# Patient Record
Sex: Male | Born: 1965 | Race: White | Hispanic: No | Marital: Married | Smoking: Never smoker
Health system: Southern US, Community
[De-identification: ages and names within clinical notes are randomized; demographics above are authoritative.]

## PROBLEM LIST (undated history)

## (undated) DIAGNOSIS — I10 Essential (primary) hypertension: Secondary | ICD-10-CM

## (undated) DIAGNOSIS — R7303 Prediabetes: Secondary | ICD-10-CM

## (undated) DIAGNOSIS — F419 Anxiety disorder, unspecified: Secondary | ICD-10-CM

## (undated) HISTORY — PX: HERNIA REPAIR: SHX51

---

## 2003-09-08 ENCOUNTER — Inpatient Hospital Stay (HOSPITAL_COMMUNITY): Admission: EM | Admit: 2003-09-08 | Discharge: 2003-09-10 | Payer: Self-pay | Admitting: *Deleted

## 2003-09-12 ENCOUNTER — Emergency Department (HOSPITAL_COMMUNITY): Admission: EM | Admit: 2003-09-12 | Discharge: 2003-09-13 | Payer: Self-pay | Admitting: *Deleted

## 2006-06-26 ENCOUNTER — Ambulatory Visit: Admission: RE | Admit: 2006-06-26 | Discharge: 2006-06-26 | Payer: Self-pay | Admitting: Family Medicine

## 2006-06-28 ENCOUNTER — Ambulatory Visit: Payer: Self-pay | Admitting: Pulmonary Disease

## 2009-08-15 ENCOUNTER — Ambulatory Visit (HOSPITAL_COMMUNITY): Admission: RE | Admit: 2009-08-15 | Discharge: 2009-08-16 | Payer: Self-pay | Admitting: Surgery

## 2009-08-15 ENCOUNTER — Encounter (INDEPENDENT_AMBULATORY_CARE_PROVIDER_SITE_OTHER): Payer: Self-pay | Admitting: Surgery

## 2010-07-01 NOTE — Procedures (Signed)
NAMEARLING, Phillip [name]               ACCOUNT NO.:  [id number]   MEDICAL RECORD NO.:  [id number]          PATIENT TYPE:  OUT   LOCATION:  SLEEP LAB                     FACILITY:  [facility]   PHYSICIAN:  Barbaraann Share, MD,FCCPDATE OF BIRTH:  03/25/1967   DATE OF STUDY:  06/26/2006                            NOCTURNAL POLYSOMNOGRAM   REFERRING PHYSICIAN:   LOCATION:  Sleep lab.   REFERRING PHYSICIAN:  Dr. Marjory Lies.   INDICATIONS FOR THE STUDY:  Hypersomnia with sleep apnea.   EPWORTH SCORE:  Is 18.   SLEEP ARCHITECTURE:  The patient had a total sleep time of 310 minutes  with decreased slow wave sleep for age and very decreased REM.  Sleep  onset latency was normal and REM onset was very prolonged.  Sleep  efficiency was decreased at 78%.   RESPIRATORY DATA:  The patient underwent split night protocol where he  was found to have 207 obstructive events in the first 128 minutes of  sleep.  This gave the patient an apnea/hypopnea index over that time  period of 97 events per hour.  The events were not positional and there  was moderate snoring noted throughout.  By protocol the patient was  placed on a standard Ultram Mirage ResMed nasal CPAP mask and ultimately  titrated to a final pressure of 10-cm of water with excellent control  even through supine REM.   OXYGEN DATA:  The patient had O2 desaturation as low as 82% with his  obstructive events.   CARDIAC DATA:  The patient was noted to have cardiac decelerations with  the obstructive events.   MOVEMENT/PARASOMNIA:  No clinically significant leg jerks or abnormal  behaviors were noted.   IMPRESSION/RECOMMENDATIONS:  1. Split night study reveals very severe obstructive sleep      apnea/hypopnea syndrome with an apnea/hypopnea index during the      first half of the night of 97 events per hour.  There was O2      desaturation as low as 82%.  The patient was then placed on a      standard ResMed Ultram Mirage nasal mask and  ultimately titrated to      a final      pressure of 10-cm with excellent control even through supine REM.  2. Cardiac decelerations were noted with the patient's obstructive      events.      Barbaraann Share, MD,FCCP  Diplomate, American Board of Sleep  Medicine     [facility]/MEDQ  D:  06/30/2006 17:29:22  T:  07/01/2006 06:45:35  Job:  [id number]

## 2010-07-04 NOTE — H&P (Signed)
NAMEFRANZ, SVEC                         ACCOUNT NO.:  [id number]   MEDICAL RECORD NO.:  [id number]                   PATIENT TYPE:  IPS   LOCATION:  0507                                 FACILITY:  [facility]   PHYSICIAN:  Jasmine Pang, M.D.              DATE OF BIRTH:  03/02/1965   DATE OF ADMISSION:  09/08/2003  DATE OF DISCHARGE:                         PSYCHIATRIC ADMISSION ASSESSMENT   IDENTIFYING INFORMATION:  This is a voluntary admission.  This is a 45-year-  old married white male.  He presented at [facility] on  Thursday, July 21 and was prescribed Lexapro and Lenesta.  He was  complaining of mood swings and depression.  This had been precipitated by  financial issues.  Apparently 2-3 months ago he and his wife purchased a  home and there were repairs that were supposed to have been made, however  the seller did not make the repairs and this has transferred a large  financial burden to the patient and his wife.  He has lost 20 pounds in the  past month.  He has had an inability to concentrate, to complete tasks.  He  has not been sleeping well and it is now impacting his performance at work.  He states I feel somewhat like I'm being punished from a religious  perspective.  He was unwilling to disclose the perceived wrongdoing to the  ACT team member.  However apparently he has been viewing pornography on the  internet.  His father is a Optician, dispensing and he has a lot of religious overlay.   On Thursday when his newest infant daughter was born he had a visual  hallucinations of satan and hence that is what prompted his visit to  [facility].  He has also had thoughts to kill himself.  There  is a gun in the house, although he does not believe he would follow through.  His sleep is decreased.  He is awakening 3-4 times a night.  He has had mid  to terminal insomnia for the past 2-3 months.  He is obsessed with concerns  about the house.  The thoughts  are intrusive.   PAST PSYCHIATRIC HISTORY:  He has none.   SOCIAL HISTORY:  He received his B.S. for [employer].  He is employed  at [employer].  He has been married for 9 years.  He has a 26-year-old  daughter, a 74-month-old daughter, and an infant daughter born now 3 days  ago.   FAMILY HISTORY:  He states his paternal grandmother had a nervous breakdown  because of her marriage.   ALCOHOL AND DRUG ABUSE:  He denies.   PAST MEDICAL HISTORY:  Primary care Kollin Udell:  He has not actually been to a  physician prior to his visit at [facility] the other day,  since 1999.  He has no known medical problems.  He is not prescribed any  medications, although while at [facility] he was started on [place] for sleep.   ALLERGIES:  No known drug allergies.   POSITIVE PHYSICAL FINDINGS:  PHYSICAL EXAMINATION:  There were no positive  physical findings.  He is a well-nourished, well-developed young male who  appears his stated age of 67.  He does have some acne.  He is still a little  overweight but other than that his exam was within normal limits.   MENTAL STATUS EXAM:  He is alert and oriented x3.  He is well groomed and  dressed.  His speech is not pressured.  His mood is somewhat depressed and  anxious.  His affect is congruent.  His thought processes are not clear or  rational.  He is concerned about forgiveness, etc.  His judgment and insight  are fair, concentration and memory are fair, intelligence is at least  average.  He denies any further visual hallucinations from the other day.  He is not paranoid.  He is not deluded per se but he is somewhat obsessed  and he is stuck, he is ruminating.   ADMISSION DIAGNOSES:   AXIS I:  Major depressive disorder, single, severe, with psychotic features,  specifically visual hallucinations.   AXIS II:  Deferred.   AXIS III:  None known.   AXIS IV:  Severe.  Housing and economic.   AXIS V:  Global  assessment of function is 35.   PLAN:  Plan is to admit for safety and crisis stabilization.  To get him  some sleep, to initiate medications.  Post discharge, he is requesting  [place] counseling.  He wonders if his father, who is a Optician, dispensing, would  be appropriate.  It was suggested that he consider Dr. Alanson Aly as well  as Janalee Dane in Dr. Alwyn Ren office for therapy.  Again, he is trying to  minimize his situation.  He is quite embarrassed that he is here and does  not want to have to take medication.     Mickie Leonarda Salon, P.A.-C.               Jasmine Pang, M.D.    MD/MEDQ  D:  09/09/2003  T:  09/09/2003  Job:  [id number]

## 2010-07-04 NOTE — Discharge Summary (Signed)
Phillip [name], Phillip [name]                         ACCOUNT NO.:  [id number]   MEDICAL RECORD NO.:  [id number]                   PATIENT TYPE:  IPS   LOCATION:  0507                                 FACILITY:  [facility]   PHYSICIAN:  Geoffery Lyons, M.D.                   DATE OF BIRTH:  24-Nov-1965   DATE OF ADMISSION:  09/08/2003  DATE OF DISCHARGE:  09/10/2003                                 DISCHARGE SUMMARY   CHIEF COMPLAINT AND PRESENTING ILLNESS:  This was the first admission to  [facility]  for this 45 year old married white male,  presented to  Urgent Care, was prescribed Lexapro and Lunesta.  Complained  of mood swings and depression.  Had been precipitated by financial issues.  Apparently 2 or 3 months prior to this admission he and his wife purchased a  home and there were repairs that were supposed to have been made.  The  seller did not make the repairs and this resulted in a financial burden to  the patient and his wife.  Lost 20 pounds in the past month.  Inability to  concentrate, to complete tasks.  Had not been sleeping well.  Impacting his  performance at work.  He was religiously preoccupied.  He had apparently  been viewing pornography on the internet.  His father is a Optician, dispensing and he  had a lot of religious concerns.  On Thursday when his newest infant  daughter was born, he had a visual hallucination of Prudy Feeler and this prompted  to reach his brother and thought to kill himself.  There was a gun in the  house.  He did not believe he could follow through.   PAST PSYCHIATRIC HISTORY:  Denies history of previous treatment.   ALCOHOL AND DRUG HISTORY:  Denies the use or abuse of any substances.   PAST MEDICAL HISTORY:  Negative for any major medical conditions.   MEDICATIONS:  Recently prescribed Lexapro and Lunesta.   PHYSICAL EXAMINATION:  Performed, failed to show any acute findings.   LABORATORY WORKUP:  CBC within normal limits.  Blood chemistries were  within  normal limits.  SGOT 40, SGPT 37, total bilirubin 1.3.  Triglycerides  158.  TSH 2.789.   MENTAL STATUS EXAM:  Reveals an alert, cooperative male, well groomed and  dressed.  Speech was not pressured.  Mood depressed and anxious, affect was  congruent.  Thought processes were not clear, rational, concerned about  forgiveness.  Judgment and insight and insight were fair, concentration and  memory were fair. Sense of hopelessness, helplessness, concerned about  religious issues and guilt and shame.   ADMISSION DIAGNOSES:   AXIS I:  Major depressive disorder, rule out psychotic features.   AXIS II:  No diagnosis.   AXIS III:  No diagnosis.   AXIS IV:  Moderate.   AXIS V:  Global assessment of function upon admission 35,  highest global  assessment of function in past year 80.   COURSE IN HOSPITAL:  He was admitted and started on intensive individual and  group psychotherapy.  He was maintained on Lexapro.  He was given some  Ativan and Ambien for sleep.  By July 25, he was in full contact with  reality.  Endorsed no suicidal ideas, no homicidal ideas, no hallucinations,  no delusions.  Stated that he was feeling better once he was able to open up  and talk about his issues.  York Spaniel that his wife has already forgiven him.  He  felt that he was ready to move forward.  Endorsed some financial stress but  endorsed no suicidal ideas, no homicidal ideas.  Wanted to be out of the  hospital so he could resume his life.  There was a family session with the  wife, reported no suicidal ideation, endorsed that he would never act on any  ideas to hurt himself for the sake of his wife and his children.  Evidenced  some insight.  It was felt that he was baseline so he was discharged to  outpatient follow-up.   DISCHARGE DIAGNOSES:   AXIS I:  1. Major depression, single episode.  2. Anxiety disorder not otherwise specified.   AXIS II:  No diagnosis.   AXIS III:  No diagnosis.   AXIS  IV:  Moderate.   AXIS V:  Global assessment of function upon discharge 60.   DISCHARGE MEDICATIONS:  1. Lexapro 10 mg per day.  2. Ambien 10 at bedtime for sleep.   DISPOSITION:  Follow up with Dr. Lolly Mustache and therapist Florencia Reasons.                                               Geoffery Lyons, M.D.    IL/MEDQ  D:  10/03/2003  T:  10/04/2003  Job:  [id number]

## 2019-01-10 ENCOUNTER — Other Ambulatory Visit: Payer: Self-pay | Admitting: *Deleted

## 2019-01-10 DIAGNOSIS — Z20822 Contact with and (suspected) exposure to covid-19: Secondary | ICD-10-CM

## 2019-01-11 LAB — NOVEL CORONAVIRUS, NAA: SARS-CoV-2, NAA: NOT DETECTED

## 2019-01-29 ENCOUNTER — Other Ambulatory Visit: Payer: Self-pay

## 2019-01-29 ENCOUNTER — Emergency Department
Admission: EM | Admit: 2019-01-29 | Discharge: 2019-01-29 | Discharge status: Absent without leave | Payer: BC Managed Care – PPO | Source: Home / Self Care

## 2019-09-08 ENCOUNTER — Ambulatory Visit
Admission: EM | Admit: 2019-09-08 | Discharge: 2019-09-08 | Disposition: A | Discharge status: Home / Self Care | Payer: BC Managed Care – PPO | Attending: Emergency Medicine | Admitting: Emergency Medicine

## 2019-09-08 ENCOUNTER — Ambulatory Visit: Payer: Self-pay

## 2019-09-08 ENCOUNTER — Other Ambulatory Visit: Payer: Self-pay

## 2019-09-08 ENCOUNTER — Encounter: Payer: Self-pay | Admitting: Emergency Medicine

## 2019-09-08 DIAGNOSIS — M7521 Bicipital tendinitis, right shoulder: Secondary | ICD-10-CM | POA: Diagnosis not present

## 2019-09-08 HISTORY — DX: Prediabetes: R73.03

## 2019-09-08 MED ORDER — PREDNISONE 10 MG (21) PO TBPK: ORAL_TABLET | Freq: Every day | ORAL | 0 refills | Status: DC

## 2019-09-08 NOTE — ED Triage Notes (Signed)
Pain to bicep area on RT arm x 2weeks. At times RT hand feels numb and swollen.  Pt works on a computer all day

## 2019-09-08 NOTE — ED Provider Notes (Signed)
[facility]   [id number] 09/08/19 Arrival Time: 1534  CC: RT shoulder/ arm PAIN  SUBJECTIVE: History from: patient. Phillip [name] is a 54 y.o. male complains of RT arm pain x 1.5 weeks.  Denies a precipitating event or specific injury.  Does admit to frequent computer use.  Localizes the pain to the RT biceps.  Describes the pain as intermittent and sharp in character.  Has tried OTC medications with relief.  Symptoms are made worse with ROM.  Denies similar symptoms in the past.  Reports associated numbness in the RT hand.  Denies fever, chills, erythema, ecchymosis, effusion, weakness.    ROS: As per HPI.  All other pertinent ROS negative.     Past Medical History:  Diagnosis Date  . Borderline diabetic    Past Surgical History:  Procedure Laterality Date  . HERNIA REPAIR     No Known Allergies No current facility-administered medications on file prior to encounter.   No current outpatient medications on file prior to encounter.   Social History   Socioeconomic History  . Marital status: Married    Spouse name: Not on file  . Number of children: Not on file  . Years of education: Not on file  . Highest education level: Not on file  Occupational History  . Not on file  Tobacco Use  . Smoking status: Never Smoker  . Smokeless tobacco: Never Used  Substance and Sexual Activity  . Alcohol use: Never  . Drug use: Never  . Sexual activity: Not on file  Other Topics Concern  . Not on file  Social History Narrative  . Not on file   Social Determinants of Health   Financial Resource Strain:   . Difficulty of Paying Living Expenses:   Food Insecurity:   . Worried About Programme researcher, broadcasting/film/video in the Last Year:   . Barista in the Last Year:   Transportation Needs:   . Freight forwarder (Medical):   Marland Kitchen Lack of Transportation (Non-Medical):   Physical Activity:   . Days of Exercise per Week:   . Minutes of Exercise per Session:   Stress:   .  Feeling of Stress :   Social Connections:   . Frequency of Communication with Friends and Family:   . Frequency of Social Gatherings with Friends and Family:   . Attends Religious Services:   . Active Member of Clubs or Organizations:   . Attends Banker Meetings:   Marland Kitchen Marital Status:   Intimate Partner Violence:   . Fear of Current or Ex-Partner:   . Emotionally Abused:   Marland Kitchen Physically Abused:   . Sexually Abused:    Family History  Problem Relation Age of Onset  . Diabetes Mother   . Diabetes Father   . Heart failure Father     OBJECTIVE:  Vitals:   09/08/19 1604 09/08/19 1607  BP:  (!) 137/91  Pulse:  67  Resp:  18  Temp:  98.2 F (36.8 C)  TempSrc:  Oral  SpO2:  95%  Weight: (!) 233 lb 11 oz (106 kg)   Height: 5\' 8"  (1.727 m)     General appearance: ALERT; in no acute distress.  Head: NCAT Lungs: Normal respiratory effort CV: Radial pulse 2+. Cap refill < 2 seconds Musculoskeletal: Right shoulder/ arm Inspection: Skin warm, dry, clear and intact without obvious erythema, effusion, or ecchymosis.  Palpation: Nontender to palpation ROM: FROM active and passive Strength: 5/5 shld  abduction, 5/5 shld adduction, 5/5 elbow flexion, 5/5 elbow extension, 5/5 grip strength Skin: warm and dry Neurologic: Ambulates without difficulty; Sensation intact about the upper/ extremities Psychological: alert and cooperative; normal mood and affect   ASSESSMENT & PLAN:  1. Biceps tendinitis of right shoulder    Meds ordered this encounter  Medications  . predniSONE (STERAPRED UNI-PAK 21 TAB) 10 MG (21) TBPK tablet    Sig: Take by mouth daily. Take 6 tabs by mouth daily  for 2 days, then 5 tabs for 2 days, then 4 tabs for 2 days, then 3 tabs for 2 days, 2 tabs for 2 days, then 1 tab by mouth daily for 2 days    Dispense:  42 tablet    Refill:  0    Order Specific Question:   Supervising Provider    Answer:   Eustace Moore [[id number]]    Continue  conservative management of rest, ice, and gentle stretches  Limit painful activities  Prednisone prescribed. Take as directed and to completion Follow up with PCP or orthopedist if symptoms persist Return or go to the ER if you have any new or worsening symptoms (fever, chills, chest pain, redness, swelling, bruising, deformity, etc...)    Reviewed expectations re: course of current medical issues. Questions answered. Outlined signs and symptoms indicating need for more acute intervention. Patient verbalized understanding. After Visit Summary given.    Rennis Harding, PA-C 09/08/19 1628

## 2019-09-08 NOTE — Discharge Instructions (Signed)
Continue conservative management of rest, ice, and gentle stretches  Limit painful activities  Prednisone prescribed. Take as directed and to completion Follow up with PCP or orthopedist if symptoms persist Return or go to the ER if you have any new or worsening symptoms (fever, chills, chest pain, redness, swelling, bruising, deformity, etc...)

## 2019-10-24 ENCOUNTER — Other Ambulatory Visit: Payer: Self-pay | Admitting: Orthopaedic Surgery

## 2019-10-24 DIAGNOSIS — M542 Cervicalgia: Secondary | ICD-10-CM

## 2019-10-25 ENCOUNTER — Other Ambulatory Visit: Payer: Self-pay | Admitting: Orthopaedic Surgery

## 2019-10-25 DIAGNOSIS — M542 Cervicalgia: Secondary | ICD-10-CM

## 2019-11-14 ENCOUNTER — Ambulatory Visit
Admission: RE | Admit: 2019-11-14 | Discharge: 2019-11-14 | Disposition: A | Discharge status: Home / Self Care | Payer: BC Managed Care – PPO | Source: Ambulatory Visit | Attending: Orthopaedic Surgery | Admitting: Orthopaedic Surgery

## 2019-11-14 ENCOUNTER — Other Ambulatory Visit: Payer: Self-pay

## 2019-11-14 DIAGNOSIS — M542 Cervicalgia: Secondary | ICD-10-CM

## 2021-04-08 LAB — COLOGUARD: COLOGUARD: NEGATIVE

## 2021-12-30 IMAGING — CR DG ORBITS FOR FOREIGN BODY
2 series · 2 of 2 positions shown · non-contrast
Comparison: None.

CLINICAL DATA: Metal working/exposure; clearance prior to MRI

EXAM:
ORBITS FOR FOREIGN BODY - 2 VIEW

[w orbit pa (1 of 2)]
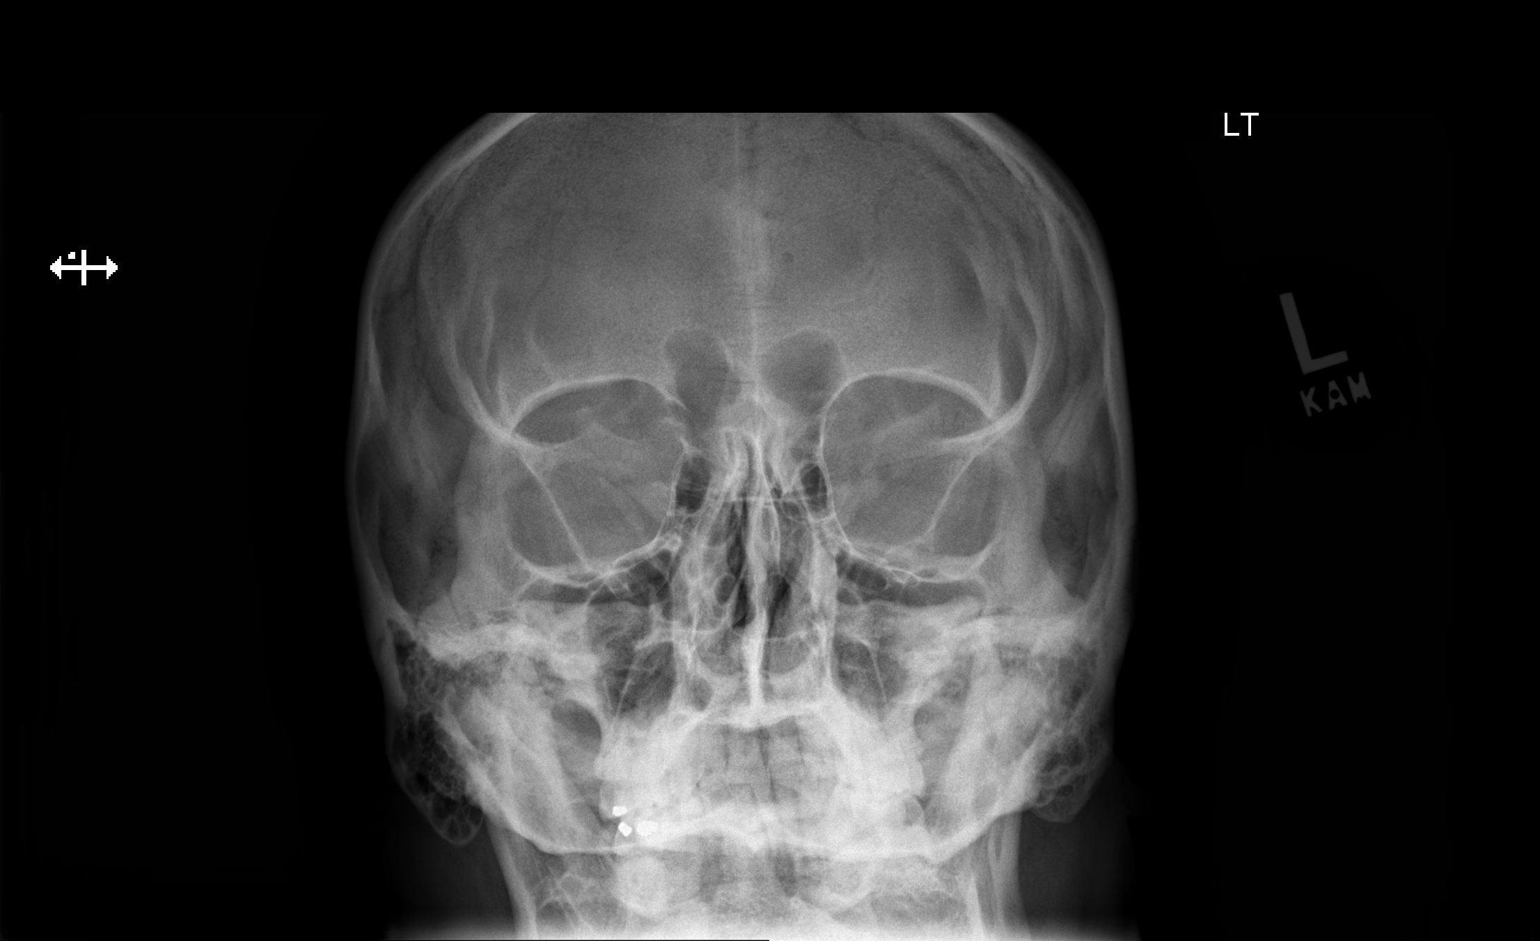

[w orbit pa (2 of 2)]
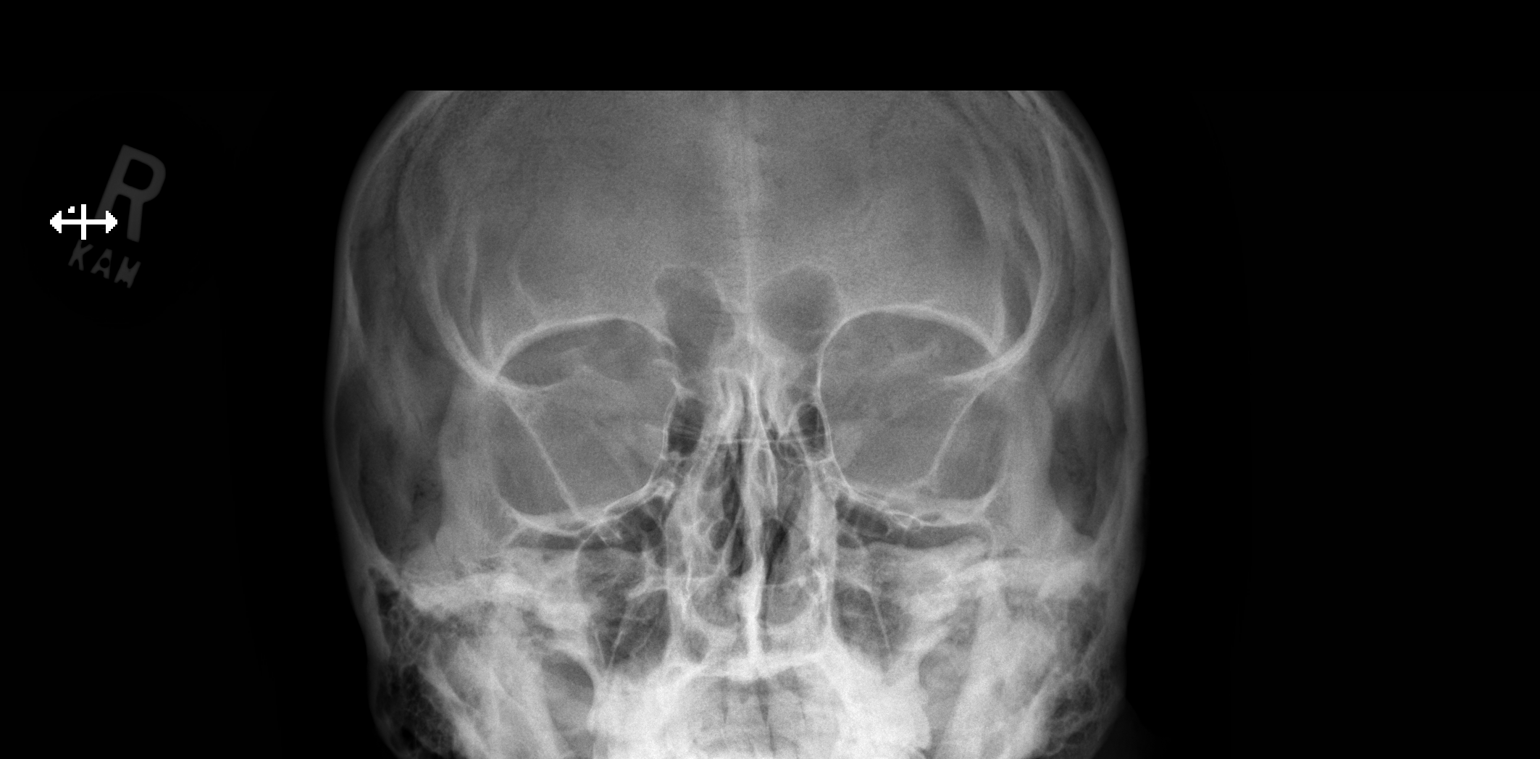

[2 of 2 positions shown; findings below may reference images not displayed]

FINDINGS: There is no evidence of metallic foreign body within the orbits. No
significant bone abnormality identified.
IMPRESSION: No evidence of metallic foreign body within the orbits.

## 2023-05-19 NOTE — Progress Notes (Signed)
 Date of Service: 05/19/2023 Patient DOB: 09-08-1965   Subjective:     Patient ID: Phillip [name] is a 58 y.o. male. Patient comes in for Medication Refill and Diabetes The patient is a 58yo male who presents for annual physical and medical evaluation. The patient has a history of:             -Diabetes: on metformin  1000 mg twice a day.       04/23 patient was started on Ozempic and titrated to 1mg  weekly, currently has no GI issues,  12/22/21 222lbs  08/11/22: 226lbs   05/19/23 (226 lbs).               A1C:   Lab Results  Component Value Date  HGBA1C 6.4 (H) 08/11/2022        Lipids:   Lab Results  Component Value Date  CHOL 128 08/11/2022  CHOL 148 03/17/2021     Lab Results  Component Value Date  HDL 47 (L) 08/11/2022  HDL 48 03/17/2021     Lab Results  Component Value Date  LDLCALC 59 08/11/2022     Lab Results  Component Value Date  TRIG 132 08/11/2022  TRIG 119 03/17/2021     No results found for: CHOLHDL     Microalbumin:  No results found for: LABPROT          Microalbumin: due  Component Value Date      ALBUMIN 4.4 03/17/2021             Blood sugars: no      Eye exam: due 05/23, eye doctor is in [place]       Foot exam:  05/19/23           -Hypertension: on valsartan 160 mg daily.         -elevated bilirubin:             Labs:    06/24 CBC, cholesterol, and PSA labs are normal.  Patient's BMP labs showed slightly elevated blood sugar and would recommend to decrease the carbohydrates in his diet and to stay active.  Patient's total bilirubin was slightly elevated (1.1 from 0.9) which has not changed much from the prior lab, A1c finally came back and has improved from 8.2-6.4.      03/18/21 CBC, cholesterol, hepatitis C, PSA, microalbumin labs were normal. Patient CMP had elevated blood sugars with an A1c of 8.2 meaning that patient's diabetes is not controlled.       Misc:       Review of Systems Review of Systems   Constitutional:  Negative for chills, fatigue and fever.  Respiratory:  Negative for cough, shortness of breath and wheezing.   Cardiovascular:  Negative for chest pain, palpitations and leg swelling.  Psychiatric/Behavioral:  Negative for agitation and behavioral problems.      Past Medical History:  Diagnosis Date  . Anxiety     Past Surgical History:  Procedure Laterality Date  . HERNIA REPAIR     Procedure: HERNIA REPAIR      No Known Allergies    Social History   Social History Narrative  . Not on file    Family History  Problem Relation Name Age of Onset  . Heart disease Father    . Hearing loss Father    . Diabetes Father    . Hypertension Other    . Diabetes Other    . Stroke Other    . Fibromyalgia Mother  Objective:  BP 128/82   Pulse 88   Temp 98.2 F (36.8 C)   Resp 18   Ht 1.715 m (5' 7.52)   Wt 103 kg (226 lb 6.4 oz)   SpO2 98%   BMI 34.92 kg/m   Physical Exam Physical Exam Constitutional:      General: He is not in acute distress.    Appearance: Normal appearance. He is not ill-appearing.  HENT:     Head: Normocephalic and atraumatic.  Cardiovascular:     Rate and Rhythm: Normal rate and regular rhythm.     Heart sounds: No murmur heard. Pulmonary:     Effort: Pulmonary effort is normal. No respiratory distress.     Breath sounds: No stridor. No wheezing, rhonchi or rales.  Neurological:     Mental Status: He is alert.  Psychiatric:        Mood and Affect: Mood normal.        Behavior: Behavior normal.         Labs: No results found for this or any previous visit (from the past week).  Assessment/Plan:  1. Type 2 diabetes mellitus without complication, without long-term current use of insulin   (Primary) ffice A1c 6.3 which is at goal below 7.0. Continue current medications. Follow-up with your eye doctor for annual diabetic eye exam.  Recommend a low fat/carbohydrate diet with increase in exercising.  - POC  Hemoglobin A1c  2. Essential hypertension controlled, continue current medication  Follow-up in 6 months for annual physical or sooner if needed.  Call for any questions/concerns.   Electronically signed by: Shawn P Lazoff, DO 05/19/2023 4:03 PM  This document was created using the aid of voice recognition Dragon dictation software.

## 2023-10-19 ENCOUNTER — Ambulatory Visit (HOSPITAL_COMMUNITY)
Admission: EM | Admit: 2023-10-19 | Discharge: 2023-10-19 | Disposition: A | Discharge status: Other Acute Inpatient Hospital

## 2023-10-19 ENCOUNTER — Inpatient Hospital Stay
Admission: AD | Admit: 2023-10-19 | Discharge: 2023-10-25 | DRG: 885 | Disposition: A | Discharge status: Home / Self Care | Source: Intra-hospital | Attending: Psychiatry | Admitting: Psychiatry

## 2023-10-19 DIAGNOSIS — F653 Voyeurism: Secondary | ICD-10-CM | POA: Insufficient documentation

## 2023-10-19 DIAGNOSIS — R45851 Suicidal ideations: Secondary | ICD-10-CM | POA: Insufficient documentation

## 2023-10-19 DIAGNOSIS — I10 Essential (primary) hypertension: Secondary | ICD-10-CM | POA: Diagnosis present

## 2023-10-19 DIAGNOSIS — F333 Major depressive disorder, recurrent, severe with psychotic symptoms: Principal | ICD-10-CM | POA: Diagnosis present

## 2023-10-19 DIAGNOSIS — R7303 Prediabetes: Secondary | ICD-10-CM | POA: Diagnosis present

## 2023-10-19 DIAGNOSIS — Z8249 Family history of ischemic heart disease and other diseases of the circulatory system: Secondary | ICD-10-CM

## 2023-10-19 DIAGNOSIS — F411 Generalized anxiety disorder: Secondary | ICD-10-CM | POA: Diagnosis not present

## 2023-10-19 DIAGNOSIS — Z833 Family history of diabetes mellitus: Secondary | ICD-10-CM | POA: Diagnosis not present

## 2023-10-19 DIAGNOSIS — Z7984 Long term (current) use of oral hypoglycemic drugs: Secondary | ICD-10-CM | POA: Diagnosis not present

## 2023-10-19 DIAGNOSIS — Z818 Family history of other mental and behavioral disorders: Secondary | ICD-10-CM | POA: Diagnosis not present

## 2023-10-19 DIAGNOSIS — F410 Panic disorder [episodic paroxysmal anxiety] without agoraphobia: Secondary | ICD-10-CM | POA: Diagnosis present

## 2023-10-19 DIAGNOSIS — F66 Other sexual disorders: Secondary | ICD-10-CM | POA: Diagnosis not present

## 2023-10-19 HISTORY — DX: Anxiety disorder, unspecified: F41.9

## 2023-10-19 HISTORY — DX: Prediabetes: R73.03

## 2023-10-19 HISTORY — DX: Essential (primary) hypertension: I10

## 2023-10-19 LAB — COMPREHENSIVE METABOLIC PANEL WITH GFR
ALT: 36 U/L (ref 0–44)
AST: 28 U/L (ref 15–41)
Albumin: 4.7 g/dL (ref 3.5–5.0)
Alkaline Phosphatase: 52 U/L (ref 38–126)
Anion gap: 12 (ref 5–15)
BUN: 25 mg/dL — ABNORMAL HIGH (ref 6–20)
CO2: 24 mmol/L (ref 22–32)
Calcium: 10.1 mg/dL (ref 8.9–10.3)
Chloride: 103 mmol/L (ref 98–111)
Creatinine, Ser: 1.65 mg/dL — ABNORMAL HIGH (ref 0.61–1.24)
GFR, Estimated: 48 mL/min — ABNORMAL LOW (ref >60)
Glucose, Bld: 101 mg/dL — ABNORMAL HIGH (ref 70–99)
Potassium: 4.2 mmol/L (ref 3.5–5.1)
Sodium: 139 mmol/L (ref 135–145)
Total Bilirubin: 2 mg/dL — ABNORMAL HIGH (ref 0.0–1.2)
Total Protein: 7.6 g/dL (ref 6.5–8.1)

## 2023-10-19 LAB — CBC WITH DIFFERENTIAL/PLATELET
Abs Immature Granulocytes: 0.02 K/uL (ref 0.00–0.07)
Basophils Absolute: 0.1 K/uL (ref 0.0–0.1)
Basophils Relative: 1 %
Eosinophils Absolute: 0.1 K/uL (ref 0.0–0.5)
Eosinophils Relative: 1 %
HCT: 52.2 % — ABNORMAL HIGH (ref 39.0–52.0)
Hemoglobin: 17.9 g/dL — ABNORMAL HIGH (ref 13.0–17.0)
Immature Granulocytes: 0 %
Lymphocytes Relative: 25 %
Lymphs Abs: 2 K/uL (ref 0.7–4.0)
MCH: 32.1 pg (ref 26.0–34.0)
MCHC: 34.3 g/dL (ref 30.0–36.0)
MCV: 93.7 fL (ref 80.0–100.0)
Monocytes Absolute: 0.7 K/uL (ref 0.1–1.0)
Monocytes Relative: 9 %
Neutro Abs: 5.1 K/uL (ref 1.7–7.7)
Neutrophils Relative %: 64 %
Platelets: 312 K/uL (ref 150–400)
RBC: 5.57 MIL/uL (ref 4.22–5.81)
RDW: 12.6 % (ref 11.5–15.5)
WBC: 7.9 K/uL (ref 4.0–10.5)
nRBC: 0 % (ref 0.0–0.2)

## 2023-10-19 LAB — LIPID PANEL
Cholesterol: 172 mg/dL (ref 0–200)
HDL: 52 mg/dL (ref >40)
LDL Cholesterol: 95 mg/dL (ref 0–99)
Total CHOL/HDL Ratio: 3.3 ratio
Triglycerides: 127 mg/dL (ref <150)
VLDL: 25 mg/dL (ref 0–40)

## 2023-10-19 LAB — POCT URINE DRUG SCREEN - MANUAL ENTRY (I-SCREEN)
POC Amphetamine UR: NOT DETECTED
POC Buprenorphine (BUP): NOT DETECTED
POC Cocaine UR: NOT DETECTED
POC Marijuana UR: NOT DETECTED
POC Methadone UR: NOT DETECTED
POC Methamphetamine UR: NOT DETECTED
POC Morphine: NOT DETECTED
POC Oxazepam (BZO): POSITIVE — AB
POC Oxycodone UR: NOT DETECTED
POC Secobarbital (BAR): NOT DETECTED

## 2023-10-19 LAB — GLUCOSE, CAPILLARY: Glucose-Capillary: 153 mg/dL — ABNORMAL HIGH (ref 70–99)

## 2023-10-19 LAB — HEMOGLOBIN A1C
Hgb A1c MFr Bld: 6 % — ABNORMAL HIGH (ref 4.8–5.6)
Mean Plasma Glucose: 125.5 mg/dL

## 2023-10-19 LAB — ETHANOL: Alcohol, Ethyl (B): <15 mg/dL (ref <15)

## 2023-10-19 LAB — TSH: TSH: 2.398 u[IU]/mL (ref 0.350–4.500)

## 2023-10-19 LAB — SARS CORONAVIRUS 2 BY RT PCR: SARS Coronavirus 2 by RT PCR: NEGATIVE

## 2023-10-19 MED ORDER — OLANZAPINE 10 MG IM SOLR: 5.0000 mg | Freq: Three times a day (TID) | INTRAMUSCULAR | Status: DC | PRN

## 2023-10-19 MED ORDER — HALOPERIDOL 5 MG PO TABS: 5.0000 mg | ORAL_TABLET | Freq: Three times a day (TID) | ORAL | Status: DC | PRN

## 2023-10-19 MED ORDER — METFORMIN HCL ER (OSM) 1000 MG PO TB24
1000.0000 mg | ORAL_TABLET | Freq: Two times a day (BID) | ORAL | Status: AC
Start: 2023-10-19 — End: ?

## 2023-10-19 MED ORDER — HALOPERIDOL LACTATE 5 MG/ML IJ SOLN: 10.0000 mg | Freq: Three times a day (TID) | INTRAMUSCULAR | Status: DC | PRN

## 2023-10-19 MED ORDER — TRAZODONE HCL 50 MG PO TABS: 50.0000 mg | ORAL_TABLET | Freq: Every evening | ORAL | Status: DC | PRN

## 2023-10-19 MED ORDER — METFORMIN HCL ER 500 MG PO TB24
1000.0000 mg | ORAL_TABLET | Freq: Two times a day (BID) | ORAL | Status: DC
  Administered 2023-10-20 – 2023-10-25 (×12): 1000 mg via ORAL
  Filled 2023-10-19 (×12): qty 2

## 2023-10-19 MED ORDER — ALUM & MAG HYDROXIDE-SIMETH 200-200-20 MG/5ML PO SUSP: 30.0000 mL | ORAL | Status: DC | PRN

## 2023-10-19 MED ORDER — IRBESARTAN 150 MG PO TABS: 150.0000 mg | ORAL_TABLET | Freq: Every day | ORAL | Status: DC

## 2023-10-19 MED ORDER — ACETAMINOPHEN 325 MG PO TABS: 650.0000 mg | ORAL_TABLET | Freq: Four times a day (QID) | ORAL | Status: DC | PRN

## 2023-10-19 MED ORDER — LORAZEPAM 2 MG/ML IJ SOLN: 2.0000 mg | Freq: Three times a day (TID) | INTRAMUSCULAR | Status: DC | PRN

## 2023-10-19 MED ORDER — DIPHENHYDRAMINE HCL 50 MG/ML IJ SOLN: 50.0000 mg | Freq: Three times a day (TID) | INTRAMUSCULAR | Status: DC | PRN

## 2023-10-19 MED ORDER — TRAZODONE HCL 50 MG PO TABS
50.0000 mg | ORAL_TABLET | Freq: Every evening | ORAL | Status: DC | PRN
  Administered 2023-10-19 – 2023-10-20 (×2): 50 mg via ORAL
  Filled 2023-10-19 (×5): qty 1

## 2023-10-19 MED ORDER — HYDROXYZINE HCL 25 MG PO TABS
25.0000 mg | ORAL_TABLET | Freq: Three times a day (TID) | ORAL | Status: DC | PRN
  Filled 2023-10-19: qty 1

## 2023-10-19 MED ORDER — MAGNESIUM HYDROXIDE 400 MG/5ML PO SUSP: 30.0000 mL | Freq: Every day | ORAL | Status: DC | PRN

## 2023-10-19 MED ORDER — IRBESARTAN 150 MG PO TABS: 150.0000 mg | ORAL_TABLET | Freq: Every day | ORAL | Status: AC

## 2023-10-19 MED ORDER — IRBESARTAN 150 MG PO TABS
150.0000 mg | ORAL_TABLET | Freq: Every day | ORAL | Status: DC
  Administered 2023-10-20 – 2023-10-25 (×6): 150 mg via ORAL
  Filled 2023-10-19 (×6): qty 1

## 2023-10-19 MED ORDER — HYDROXYZINE HCL 25 MG PO TABS: 25.0000 mg | ORAL_TABLET | Freq: Three times a day (TID) | ORAL | Status: DC | PRN

## 2023-10-19 MED ORDER — HALOPERIDOL LACTATE 5 MG/ML IJ SOLN: 5.0000 mg | Freq: Three times a day (TID) | INTRAMUSCULAR | Status: DC | PRN

## 2023-10-19 MED ORDER — METFORMIN HCL ER 500 MG PO TB24: 1000.0000 mg | ORAL_TABLET | Freq: Two times a day (BID) | ORAL | Status: DC

## 2023-10-19 MED ORDER — DIPHENHYDRAMINE HCL 50 MG PO CAPS: 50.0000 mg | ORAL_CAPSULE | Freq: Three times a day (TID) | ORAL | Status: DC | PRN

## 2023-10-19 MED ORDER — OLANZAPINE 5 MG PO TBDP: 5.0000 mg | ORAL_TABLET | Freq: Three times a day (TID) | ORAL | Status: DC | PRN

## 2023-10-19 NOTE — BH Assessment (Addendum)
 Comprehensive Clinical Assessment (CCA) Note  10/19/2023 Phillip [name] [id number]  Disposition: Per [employer], NP inpatient treatment is recommended.  [facility] to review.  Disposition SW to pursue appropriate inpatient options.  The patient demonstrates the following risk factors for suicide: Chronic risk factors for suicide include: psychiatric disorder of anxiety, depression and addiction issues. Acute risk factors for suicide include: family or marital conflict and social withdrawal/isolation. Protective factors for this patient include: positive social support, responsibility to others (children, family), and hope for the future. Considering these factors, the overall suicide risk at this point appears to be low. Patient is appropriate for outpatient follow up, once stabilized.   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 Patient presents to [facility] with his oldest daughter and wife, voluntarily.  Patient states he does not take medications. Patient reports ongoing issues with porn addiction that started at age 20.  He also admits he has viewed porn while at work, so he fears he will lose his job soon. Patient states his job of 30 years stresses him out and he has not been sleeping for approximately 1 week. Patient states it has been hard to focus, and he is also feeling paranoid believing people are plotting against him and watching him, and he is especially concerned that co-workers are watching him. Patient admits he has hurt my wife in the past by watching porn. Patient states, I just haven't been able to kick it. Patient states in the past he has received treatment for his porn addiction, depression and anxiety at [facility] and [facility] between 2005 or 2008. Per chart review, patient was hospitalized and the [facility] from 09/08/23 to 09/10/23. He denies outpatient psychiatry or counseling. He states that in the  past he was prescribed Seroquel  but stopped taking medication years ago because he felt like he did not need the medication. Treatment options were discussed, and patient is stating he prefers inpatient treatment due to his worsening depressive symptoms.  Patient endorses passive SI with no plan. Phillip denies HI, AVH and alcohol and substance use.  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 Self What Is the Reason for Your Visit/Call Today? Phillip [name] 58Y male presents to [facility] with his oldest daughter and wife, voluntarily. Phillip states he has diagnosed anxiety and depression. Phillip states he does not take medications. Phillip states he is addicted to porn and has looked at it on his job so he fears he will lose his job soon. Phillip states his job of 30 years stresses him out and he has not been sleeping for approximately 1 week. Phillip states it has been hard to focus and states he thinks the police is after him. Phillip also thinks people at work are watching him. Phillip thinks he is suffering from paranoia somewhat. Phillip states he has hurt his wife in the past by watching porn. Phillip states I just haven't been able to kick it. Phillip endorses passive SI with no plan. Phillip denies HI, AVH  and alcohol and substance use.  How Long Has This Been Causing You Problems? 1 wk - 1 month  What Do You Feel Would Help You the Most Today? Social Support; Treatment for Depression or other mood problem; Stress Management; Medication(s)   Have You Recently Had Any Thoughts About Hurting Yourself? Yes  Are You Planning to Commit Suicide/Harm Yourself At This time? No   Flowsheet Row ED from 10/19/2023 in [facility]   C-SSRS RISK CATEGORY No Risk    Have you Recently Had Thoughts About Hurting Someone Sherral? No  Are You Planning to Harm Someone at This Time? No  Explanation: N/A   Have You Used Any Alcohol or Drugs in the Past 24 Hours? No  How Long Ago Did You Use Drugs or Alcohol? N/A What Did You Use and How Much? N/A  Do You Currently Have a Therapist/Psychiatrist? No  Name of Therapist/Psychiatrist:    Have You Been Recently Discharged From Any Office Practice or Programs? No  Explanation of Discharge From Practice/Program: N/A    CCA Screening Triage Referral Assessment Type of Contact: Face-to-Face  Telemedicine Service Delivery:   Is this Initial or Reassessment?   Date Telepsych consult ordered in [facility]:    Time Telepsych consult ordered in [facility]:    Location of Assessment: [facility] Assessment Services  Provider Location: [facility] Assessment Services   Collateral Involvement: Wife provided some collateral.   Does Patient Have a Automotive engineer Guardian? No  Legal Guardian Contact Information: N/A  Copy of Legal Guardianship Form: -- (N/A)  Legal Guardian Notified of Arrival: -- (N/A)  Legal Guardian Notified of Pending Discharge: -- (N/A)  If Minor and Not Living with Parent(s), Who has Custody? N/A  Is CPS involved or ever been involved? Never  Is APS involved or ever been involved? Never   Patient Determined To Be At Risk for Harm To Self or Others Based on Review of Patient Reported Information or Presenting Complaint? No  Method: -- (N/A, no HI)  Availability of Means: -- (N/A, no HI)  Intent: -- (N/A, no HI)  Notification Required: -- (N/A, no HI)  Additional Information for Danger to Others Potential: -- (N/A, no HI)  Additional Comments for Danger to Others Potential: N/A, no HI  Are There Guns or Other Weapons in Your Home? No  Types of Guns/Weapons: N/A  Are These Weapons Safely Secured?                            -- (N/A)  Who Could  Verify You Are Able To Have These Secured: N/A  Do You Have any Outstanding Charges, Pending Court Dates, Parole/Probation? Denies  Contacted To Inform of Risk of Harm To Self or Others: Family/Significant Other:    Does Patient Present under Involuntary Commitment? No    [place] of Residence: [place]   Patient Currently Receiving the Following Services: Not Receiving Services   Determination of Need: Urgent (48 hours)   Options For Referral: [facility]; Outpatient Therapy; Inpatient Hospitalization     CCA Biopsychosocial Patient Reported Schizophrenia/Schizoaffective Diagnosis in Past: No   Strengths: Patient is seeking treatment. He has support.   Mental Health Symptoms Depression:  Difficulty Concentrating; Fatigue; Increase/decrease in appetite; Tearfulness; Worthlessness   Duration of Depressive symptoms: Duration of Depressive Symptoms: Greater than two weeks   Mania:  None   Anxiety:   Worrying; Tension; Sleep; Restlessness; Difficulty concentrating  Psychosis:  None   Duration of Psychotic symptoms:    Trauma:  None   Obsessions:  None   Compulsions:  None   Inattention:  N/A   Hyperactivity/Impulsivity:  N/A   Oppositional/Defiant Behaviors:  N/A   Emotional Irregularity:  Chronic feelings of emptiness   Other Mood/Personality Symptoms:  NA    Mental Status Exam Appearance and self-care  Stature:  Average   Weight:  Average weight   Clothing:  Casual   Grooming:  Normal   Cosmetic use:  None   Posture/gait:  Normal   Motor activity:  Not Remarkable   Sensorium  Attention:  Normal   Concentration:  Normal   Orientation:  X5   Recall/memory:  Normal   Affect and Mood  Affect:  Appropriate   Mood:  Depressed; Anxious   Relating  Eye contact:  Normal   Facial expression:  Anxious; Depressed   Attitude toward examiner:  Cooperative   Thought and Language  Speech flow: Clear and Coherent   Thought  content:  Appropriate to Mood and Circumstances   Preoccupation:  None   Hallucinations:  None   Organization:  Intact   [employer] of Knowledge:  Average   Intelligence:  Average   Abstraction:  Normal   Judgement:  Fair   Dance movement psychotherapist:  Adequate   Insight:  Fair   Decision Making:  Impulsive; Vacilates   Social Functioning  Social Maturity:  Isolates   Social Judgement:  Normal   Stress  Stressors:  Illness (SA issues)   Coping Ability:  Overwhelmed   Skill Deficits:  Self-control   Supports:  Friends/Service system; Family     Religion: Religion/Spirituality Are You A Religious Person?: No How Might This Affect Treatment?: N/A  Leisure/Recreation: Leisure / Recreation Do You Have Hobbies?: No  Exercise/Diet: Exercise/Diet Do You Exercise?: No Have You Gained or Lost A Significant Amount of Weight in the Past Six Months?: No Do You Follow a Special Diet?: No Do You Have Any Trouble Sleeping?: Yes Explanation of Sleeping Difficulties: varies   CCA Employment/Education Employment/Work Situation: Employment / Work Situation Employment Situation: Employed Work Stressors: Concerned 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 Patient's Job has Been Impacted by Current Illness: No Has Patient ever Been in the [employer]?: No  Education: Education Is Patient Currently Attending School?: No Last Grade Completed:  (NA) Did You Attend College?:  (NA) Did You Have An Individualized Education Program (IIEP): No Did You Have Any Difficulty At School?: No Patient's Education Has Been Impacted by Current Illness: No   CCA Family/Childhood History Family and Relationship History: Family history Marital status: Married Number of Years Married:  (NA) What types of issues is patient dealing with in the relationship?: Patient is mostly focused on his ongoing struggle with porn addiction.  His wife is somewhat aware  of his struggle, especially recent issues per provider report. Additional relationship information: NA Does patient have children?: Yes How many children?: 2 How is patient's relationship with their children?: No concerns noted  Childhood History:  Childhood History By whom was/is the patient raised?: Both parents Did patient suffer any verbal/emotional/physical/sexual abuse as a child?: No Did patient suffer from severe childhood neglect?: No Has patient ever been sexually abused/assaulted/raped as an adolescent or adult?: No Was the patient ever a victim of a crime or a disaster?: No Witnessed domestic violence?: No Has patient been affected by domestic violence as an adult?:  No       CCA Substance Use Alcohol/Drug Use: Alcohol / Drug Use Pain Medications: See MAR Prescriptions: See MAR Over the Counter: See MAR History of alcohol / drug use?: No history of alcohol / drug abuse                         ASAM's:  Six Dimensions of Multidimensional Assessment  Dimension 1:  Acute Intoxication and/or Withdrawal Potential:      Dimension 2:  Biomedical Conditions and Complications:      Dimension 3:  Emotional, Behavioral, or Cognitive Conditions and Complications:     Dimension 4:  Readiness to Change:     Dimension 5:  Relapse, Continued use, or Continued Problem Potential:     Dimension 6:  Recovery/Living Environment:     ASAM Severity Score:    ASAM Recommended Level of Treatment:     Substance use Disorder (SUD)    Recommendations for Services/Supports/Treatments:    Disposition Recommendation per psychiatric provider: We recommend inpatient psychiatric hospitalization when medically cleared. Patient is under voluntary admission status at this time; please IVC if attempts to leave hospital.   DSM5 Diagnoses: There are no active problems to display for this patient.    Referrals to Alternative Service(s): Referred to Alternative Service(s):    Place:   Date:   Time:    Referred to Alternative Service(s):   Place:   Date:   Time:    Referred to Alternative Service(s):   Place:   Date:   Time:    Referred to Alternative Service(s):   Place:   Date:   Time:     Deland LITTIE Louder, [facility]

## 2023-10-19 NOTE — Group Note (Signed)
 Date:  10/19/2023 Time:  11:10 PM  Group Topic/Focus:  Wrap-Up Group:   The focus of this group is to help patients review their daily goal of treatment and discuss progress on daily workbooks.    Participation Level:  Did Not Attend  Participation Quality:     Affect:     Cognitive:     Insight: None  Engagement in Group:  None  Modes of Intervention:     Additional Comments:    [name] 10/19/2023, 11:10 PM

## 2023-10-19 NOTE — ED Provider Notes (Signed)
 [facility] Continuous Assessment Admission H&P  Date: 10/19/23 Patient Name: Phillip [name] MRN: [id number] Chief Complaint: worsening depression, anxiety and passive suicidal ideations with no plan or intent.  Diagnoses:  Final diagnoses:  Severe episode of recurrent major depressive disorder, with psychotic features (HCC)  Pornography addiction  Generalized anxiety disorder  Passive suicidal ideations    HPI: Phillip [name] is a 58 year old male patient with a reported psychiatric history significant for pornography addiction, MDD and GAD who presented to the [facility] voluntary accompanied by his wife Duey Liller with complaints of worsening depression, anxiety and passive suicidal ideations with no plan or intent.  Patient reports worsening depression and anxiety for the past few months. He endorses symptoms of feelings of guilt, feeling like something bad is going to happen, excessive worry, feeling like he is being watched or followed, poor appetite and poor sleep. He reports a poor appetite for the past week and states that he's loss about 2-3lbs. He reports difficulty sleeping and states that he has been sleeping about 2 to 3 hours per night and has not been getting quality sleep for the past 6 to 8 months. He endorses passive suicidal thoughts that he describes as being better off not here and states that he would never do anything to kill himself. He reports having generalized thoughts of death for the past month with no plan or intent. He denies past suicide attempts. He reports feeling paranoid like he is going to lose his job and feeling like people are watching him. He denies auditory or visual hallucinations. Objectively, no signs of acute psychosis.   He identifies current stressors attributing to his symptoms as porn addiction and risk of losing his job, wife and children. He states that he has been watching porn since he was 58 years old  and states that his porn addiction has gotten worse over the past 3 to 4 years in frequency and and watching porn at work. He states in the past he has received treatment for his porn addiction, depression and anxiety at [facility] and [facility] between 2005 or 2008. Per chart review, patient was hospitalized and the [facility] from 09/08/23 to 09/10/23. He denies outpatient psychiatry or counseling. He states that in the past he was prescribed Seroquel  but stopped taking medication years ago because he felt like he did not need the medication.   He denies drinking alcohol or using illicit drugs. However, he later expressed taking 1 Xanax pill yesterday. He resides with his wife and 2 daughters. He has 3 adult daughters. 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 He denies legal issues.  He reports a medical history of diabetes type 2 and hypertension. He is currently prescribed valsartan, metformin  and Ozempic weekly. Per chart review, patient is prescribed Ozempic inject 1 mg every 7 days, per patient last administered on Saturday, 8/31, metformin  1000 mg twice daily, valsartan 160 mg daily, preferably at night.   Total Time spent with patient: 45 minutes  Musculoskeletal  Strength & Muscle Tone: within normal limits Gait & Station: normal Patient leans: N/A  Psychiatric Specialty Exam  Presentation General Appearance:  Appropriate for Environment  Eye Contact: Fair  Speech: Clear and Coherent  Speech Volume: Normal  Handedness: Right   Mood and Affect  Mood: Depressed  Affect: Congruent; Tearful   Thought Process  Thought Processes: Coherent  Descriptions of  Associations:Intact  Orientation:Full (Time, Place and Person)  Thought Content:Logical    Hallucinations:Hallucinations: None  Ideas of Reference:None  Suicidal Thoughts:Suicidal Thoughts: Yes, Passive SI Passive Intent and/or  Plan: Without Plan; Without Intent  Homicidal Thoughts:Homicidal Thoughts: No   Sensorium  Memory: Immediate Fair; Recent Fair; Remote Fair  Judgment: Fair  Insight: Fair   Chartered certified accountant: Fair  Attention Span: Fair  Recall: [employer] of Knowledge: Fair  Language: Fair   Psychomotor Activity  Psychomotor Activity: Psychomotor Activity: Normal   Assets  Assets: Manufacturing systems engineer; Desire for Improvement; Financial Resources/Insurance; Housing; Leisure Time; Intimacy; Physical Health; Social Support; English as a second language teacher; Vocational/Educational   Sleep  Sleep: Sleep: Poor Number of Hours of Sleep: 3   Nutritional Assessment (For OBS and FBC admissions only) Has the patient had a weight loss or gain of 10 pounds or more in the last 3 months?: No Has the patient had a decrease in food intake/or appetite?: Yes Does the patient have dental problems?: No Does the patient have eating habits or behaviors that may be indicators of an eating disorder including binging or inducing vomiting?: No Has the patient recently lost weight without trying?: 1 Has the patient been eating poorly because of a decreased appetite?: 1 Malnutrition Screening Tool Score: 2    Physical Exam Cardiovascular:     Rate and Rhythm: Tachycardia present.     Comments: Mildly hypertensive  Pulmonary:     Effort: Pulmonary effort is normal.  Musculoskeletal:        General: Normal range of motion.     Cervical back: Normal range of motion.  Neurological:     Mental Status: He is alert and oriented to person, place, and time.    Review of Systems  Constitutional: Negative.   HENT: Negative.    Eyes: Negative.   Respiratory: Negative.    Cardiovascular: Negative.   Gastrointestinal: Negative.   Genitourinary: Negative.   Musculoskeletal: Negative.   Neurological: Negative.   Endo/Heme/Allergies: Negative.   Psychiatric/Behavioral:  Positive for depression.  The patient is nervous/anxious.     Blood pressure (!) 119/93, pulse (!) 124, temperature 98.7 F (37.1 C), temperature source Temporal, resp. rate 16, SpO2 95%. There is no height or weight on file to calculate BMI.  Past Psychiatric History: A reported history of porn addiction, anxiety and depression. He states in the past he has received treatment for his porn addiction, depression and anxiety at [facility] and [facility] between 2005 or 2008. Per chart review, patient was hospitalized and the [facility] from 09/08/23 to 09/10/23. He denies outpatient psychiatry or counseling. He states that in the past he was prescribed Seroquel  but stopped taking medication years ago because he felt like he did not need the medication.    Is the patient at risk to self? Yes  Has the patient been a risk to self in the past 6 months? No .    Has the patient been a risk to self within the distant past? No   Is the patient a risk to others? No   Has the patient been a risk to others in the past 6 months? No   Has the patient been a risk to others within the distant past? No   Past Medical History: History of DM type 2 and HTN. Per chart review, patient is prescribed Ozempic inject 1 mg every 7 days, per patient last administered on Saturday, 8/31, metformin  1000 mg twice daily, valsartan 160 mg  daily, preferably at night.  Social History: He denies drinking alcohol or using illicit drugs. However, he later expressed taking 1 Xanax pill yesterday. He resides with his wife and 2 daughters. He has 3 adult daughters. 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 He denies legal issues.  Last Labs:  No visits with results within 6 Month(s) from this visit.  Latest known visit with results is:  Orders Only on 01/10/2019  Component Date Value Ref Range Status   SARS-CoV-2, NAA 01/10/2019 Not Detected  Not Detected Final   Comment: This  nucleic acid amplification test was developed and its performance characteristics determined by [employer]. Nucleic acid amplification tests include PCR and TMA. This test has not been FDA cleared or approved. This test has been authorized by FDA under an Emergency Use Authorization (EUA). This test is only authorized for the duration of time the declaration that circumstances exist justifying the authorization of the emergency use of in vitro diagnostic tests for detection of SARS-CoV-2 virus and/or diagnosis of COVID-19 infection under section 564(b)(1) of the Act, 21 U.S.C. 639aaa-6(a) (1), unless the authorization is terminated or revoked sooner. When diagnostic testing is negative, the possibility of a false negative result should be considered in the context of a patient's recent exposures and the presence of clinical signs and symptoms consistent with COVID-19. An individual without symptoms of COVID-19 and who is not shedding SARS-CoV-2 virus would                           expect to have a negative (not detected) result in this assay.     Allergies: Patient has no known allergies.  Medications:  Facility Ordered Medications  Medication   acetaminophen  (TYLENOL ) tablet 650 mg   alum & mag hydroxide-simeth (MAALOX/MYLANTA) 200-200-20 MG/5ML suspension 30 mL   magnesium  hydroxide (MILK OF MAGNESIA) suspension 30 mL   haloperidol  (HALDOL ) tablet 5 mg   And   diphenhydrAMINE  (BENADRYL ) capsule 50 mg   haloperidol  lactate (HALDOL ) injection 5 mg   And   diphenhydrAMINE  (BENADRYL ) injection 50 mg   And   LORazepam  (ATIVAN ) injection 2 mg   haloperidol  lactate (HALDOL ) injection 10 mg   And   diphenhydrAMINE  (BENADRYL ) injection 50 mg   And   LORazepam  (ATIVAN ) injection 2 mg   traZODone  (DESYREL ) tablet 50 mg   hydrOXYzine  (ATARAX ) tablet 25 mg   PTA Medications  Medication Sig   predniSONE  (STERAPRED UNI-PAK 21 TAB) 10 MG (21) TBPK tablet Take by mouth  daily. Take 6 tabs by mouth daily  for 2 days, then 5 tabs for 2 days, then 4 tabs for 2 days, then 3 tabs for 2 days, 2 tabs for 2 days, then 1 tab by mouth daily for 2 days      Medical Decision Making  Patient is recommended for inpatient psychiatric treatment for mood stabilization for worsening depression, anxiety, passive SI and paranoia in the context of porn addiction. Patient is voluntary. Per Tonita, patient accepted to [facility] hospital today, 10/19/2023. Will restart home medications metformin  1000 mg twice daily for DM and valsartan 160 mg daily for HTN. Will obtain labs, CBC, CMP, BAL, A1c, lipid panel, TSH, BAL, UDS and EKG. Lipid, and A1C labs are required on admission to determine baseline or changes in levels due to risk or metabolic syndrome caused by prescribed antipsychotic medications. BAL and UDS labs  are required on admission to assess for substance that may contribute to psychiatric symptoms and are required for diagnosing substance use disorders. Will place admission orders for [facility].     Recommendations  Based on my evaluation the patient does not appear to have an emergency medical condition.  Teresa Wyline CROME, NP 10/19/23  5:48 PM

## 2023-10-19 NOTE — ED Notes (Signed)
 Patient presents to [facility] as a voluntary walk-in accompanied by wife and daughter d/t anxiety and depressive symptoms. Patient reports increasing anxiety and depression d/t his porn addiction. Reports slight paranoia at work, thinking his co-workers are watching him since he was watching porn at work. Patient A&Ox4. Denies intent/thoughts to harm self/others when asked. Denies A/VH. Patient denies any physical complaints when asked. No distress noted. Support and encouragement provided. Routine safety checks conducted according to facility protocol. Encouraged patient to notify staff if thoughts of harm toward self or others arise. Endorses safety. Patient verbalized understanding and agreement. Plan of care ongoing, no further concerns as of present. Patient expresses no other needs at this time.
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
## 2023-10-19 NOTE — Discharge Instructions (Signed)
 Patient accepted to [facility] 9/2

## 2023-10-20 ENCOUNTER — Encounter: Payer: Self-pay | Admitting: Behavioral Health

## 2023-10-20 ENCOUNTER — Other Ambulatory Visit: Payer: Self-pay

## 2023-10-20 DIAGNOSIS — F333 Major depressive disorder, recurrent, severe with psychotic symptoms: Secondary | ICD-10-CM | POA: Diagnosis not present

## 2023-10-20 LAB — GLUCOSE, CAPILLARY: Glucose-Capillary: 184 mg/dL — ABNORMAL HIGH (ref 70–99)

## 2023-10-20 MED ORDER — ADULT MULTIVITAMIN W/MINERALS CH
1.0000 | ORAL_TABLET | Freq: Every day | ORAL | Status: DC
  Administered 2023-10-20 – 2023-10-25 (×6): 1 via ORAL
  Filled 2023-10-20 (×6): qty 1

## 2023-10-20 NOTE — H&P (Signed)
 Psychiatric Admission Assessment Adult  Patient Identification: Phillip [name] MRN:  [id number] Date of Evaluation:  10/20/2023 Chief Complaint:  MDD (major depressive disorder), recurrent, severe, with psychosis (HCC) [F33.3] Principal Diagnosis: MDD (major depressive disorder), recurrent, severe, with psychosis (HCC) Diagnosis:  Principal Problem:   MDD (major depressive disorder), recurrent, severe, with psychosis (HCC)  History of Present Illness: Patient is 58 year old male admitted to the inpatient unit voluntarily. On admission, patient reported increased anxiety, depression and paranoia that has increased in intensity over the past two weeks. He also reported increased stressors including ongoing struggle with pornography addiction, recent death of father in law and his own father's physical health declining. He reported his addiction with pornography has been the worst it's ever been. He reported it initially started with clicking links on Facebook, but has progressed to him searching on the internet for the material. He stated that he uses his personal cellphone to look up pornographic material while at work now. 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 His paranoia has now increased and he reported feeling hypervigilant and worried that he is going to get arrested while at work for the above. He reported poor sleep, getting around 4 hours of sleep at home and not feeling well rested when he wakes up. He did report history of sleep apnea, but stated he did not use his CPAP machine anymore due to losing weight. He stated his wife has not informed him of any episodes of him not breathing or gasping for air while he is asleep. His appetite is reported poor as well.  He endorsed feelings of guilt, hopelessness, fatigue, and loss of interest in hobbies. 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 He also endorsed feeling on edge, anxious, worrying constantly and thinking the worst in every situation. He denied any current or history of panic attacks. He denied mania symptoms including having too much energy, distractibility functioning with little to no sleep, & excessive spending.   He endorsed passive suicidal thoughts, such as What if I wasn't here?. He denied any plan or previous attempts. He denied HI, AH, VH, but did endorse increased paranoia, primarily related to getting in trouble for his addiction. He is not currently aligned with an outpatient mental health provider or services. Per patient report, he was admitted to an inpatient psychiatric facility around 2005, where he was started on seroquel  for a similar presentation. He reported he had some improvement in symptoms with seroquel . He is not currently on any psychiatric medications.  Associated Signs/Symptoms: Depression Symptoms:  depressed mood, anhedonia, insomnia, fatigue, feelings of worthlessness/guilt, hopelessness, anxiety, loss of energy/fatigue, disturbed sleep, (Hypo) Manic Symptoms:  None present/reported Anxiety Symptoms:  Excessive Worry, Social Anxiety, Psychotic Symptoms:  Paranoia, PTSD Symptoms: Hypervigilance:  Yes Total Time spent with patient: 1 hour  Past Psychiatric History: GAD, Depression  Is the patient at risk to self? No.  Has the patient been a risk to self in the past 6 months? No.  Has the patient been a risk to self within the distant past? No.  Is the patient a risk to others? No.  Has the patient been a risk to others in the past 6 months? No.  Has the patient been a risk to others within the distant past? No.   [place] Scale:  Flowsheet Row Admission (Current) from 10/19/2023 in  [facility] Most recent reading at 10/19/2023 11:00 PM ED from 10/19/2023 in [facility] Most recent reading at 10/19/2023  6:24 PM  C-SSRS RISK  CATEGORY Error: Q3, 4, or 5 should not be populated when Q2 is No No Risk     Prior Inpatient Therapy: Yes.   If yes, describe: Patient reported inpatient admission in 2005 for GAD/Paranoia  Prior Outpatient Therapy: No.   Alcohol Screening: 1. How often do you have a drink containing alcohol?: Never 2. How many drinks containing alcohol do you have on a typical day when you are drinking?: 1 or 2 3. How often do you have six or more drinks on one occasion?: Never AUDIT-C Score: 0 4. How often during the last year have you found that you were not able to stop drinking once you had started?: Never 5. How often during the last year have you failed to do what was normally expected from you because of drinking?: Never 6. How often during the last year have you needed a first drink in the morning to get yourself going after a heavy drinking session?: Never 7. How often during the last year have you had a feeling of guilt of remorse after drinking?: Never 8. How often during the last year have you been unable to remember what happened the night before because you had been drinking?: Never 9. Have you or someone else been injured as a result of your drinking?: No 10. Has a relative or friend or a doctor or another health worker been concerned about your drinking or suggested you cut down?: No Alcohol Use Disorder Identification Test Final Score (AUDIT): 0 Alcohol Brief Interventions/Follow-up: Patient Refused Substance Abuse History in the last 12 months:  No. Consequences of Substance Abuse: Negative Previous Psychotropic Medications: Yes  Psychological Evaluations: N/A Past Medical History:  Past Medical History:  Diagnosis Date   Anxiety    Borderline diabetic    Hypertension    Pre-diabetes     Past Surgical History:  Procedure Laterality Date   HERNIA REPAIR     Family History:  Family History  Problem Relation Age of Onset   Diabetes Mother    Diabetes Father    Heart failure  Father    Schizophrenia Paternal Grandmother    Family Psychiatric  History: Paternal grandmother history of schizophrenia. Alcoholism in multiple relatives of patient Tobacco Screening:  Social History   Tobacco Use  Smoking Status Never  Smokeless Tobacco Never    BH Tobacco Counseling     Are you interested in Tobacco Cessation Medications?  No value filed. Counseled patient on smoking cessation:  No value filed. Reason Tobacco Screening Not Completed: No value filed.       Social History:  Social History   Substance and Sexual Activity  Alcohol Use Never     Social History   Substance and Sexual Activity  Drug Use Never    Additional Social History:           Currently employed full-time at [employer]. Highest level of education is bachelor's degree in [employer]. No pending or history of legal charges. Currently lives with wife and 2/3 daughters. Reported feeling safe in the home. Reported firearms in home, but he does not have access to these as he does not have the key to the safe, and they are kept locked up. Denied any suicidal attempts in the past or familial history of suicide attempts.  Allergies:  No Known Allergies Lab Results:  Results for orders placed or performed during the hospital encounter of 10/19/23 (from the past 48 hours)  Glucose, capillary     Status: Abnormal   Collection Time: 10/19/23 10:59 PM  Result Value Ref Range   Glucose-Capillary 153 (H) 70 - 99 mg/dL    Comment: Glucose reference range applies only to samples taken after fasting for at least 8 hours.  Glucose, capillary     Status: Abnormal   Collection Time: 10/20/23  7:21 AM  Result Value Ref Range   Glucose-Capillary 184 (H) 70 - 99 mg/dL    Comment: Glucose reference range applies only to samples taken after fasting for at least 8 hours.    Blood Alcohol level:  Lab Results  Component Value Date   [facility] <15 10/19/2023    Metabolic  Disorder Labs:  Lab Results  Component Value Date   HGBA1C 6.0 (H) 10/19/2023   MPG 125.5 10/19/2023   No results found for: PROLACTIN Lab Results  Component Value Date   CHOL 172 10/19/2023   TRIG 127 10/19/2023   HDL 52 10/19/2023   CHOLHDL 3.3 10/19/2023   VLDL 25 10/19/2023   LDLCALC 95 10/19/2023    Current Medications: Current Facility-Administered Medications  Medication Dose Route Frequency Provider Last Rate Last Admin   acetaminophen  (TYLENOL ) tablet 650 mg  650 mg Oral Q6H PRN White, Patrice L, NP       alum & mag hydroxide-simeth (MAALOX/MYLANTA) 200-200-20 MG/5ML suspension 30 mL  30 mL Oral Q4H PRN White, Patrice L, NP       hydrOXYzine  (ATARAX ) tablet 25 mg  25 mg Oral TID PRN White, Patrice L, NP       irbesartan  (AVAPRO ) tablet 150 mg  150 mg Oral Daily White, Patrice L, NP   150 mg at 10/20/23 9076   magnesium  hydroxide (MILK OF MAGNESIA) suspension 30 mL  30 mL Oral Daily PRN White, Patrice L, NP       metFORMIN  (GLUCOPHAGE -XR) 24 hr tablet 1,000 mg  1,000 mg Oral BID WC White, Patrice L, NP   1,000 mg at 10/20/23 9076   multivitamin with minerals tablet 1 tablet  1 tablet Oral Daily Joah Patlan, MD   1 tablet at 10/20/23 9076   OLANZapine  (ZYPREXA ) injection 5 mg  5 mg Intramuscular TID PRN White, Patrice L, NP       OLANZapine  zydis (ZYPREXA ) disintegrating tablet 5 mg  5 mg Oral TID PRN White, Patrice L, NP       traZODone  (DESYREL ) tablet 50 mg  50 mg Oral QHS PRN White, Patrice L, NP   50 mg at 10/19/23 2245   PTA Medications: Medications Prior to Admission  Medication Sig Dispense Refill Last Dose/Taking   irbesartan  (AVAPRO ) 150 MG tablet Take 1 tablet (150 mg total) by mouth daily.      metFORMIN  (FORTAMET ) 1000 MG (OSM) 24 hr tablet Take 1 tablet (1,000 mg total) by mouth 2 (two) times daily with a meal.       AIMS:  ,  ,  ,  ,  ,  ,    Musculoskeletal: Strength & Muscle Tone: within normal limits Gait & Station: normal Patient leans:  N/A            Psychiatric Specialty Exam:  Presentation  General Appearance:  Appropriate for Environment  Eye Contact: Fair  Speech: Clear and Coherent  Speech Volume: Decreased  Handedness: Right   Mood and  Affect  Mood: Anxious; Depressed; Hopeless  Affect: Appropriate; Depressed   Thought Process  Thought Processes: Coherent; Linear; Goal Directed  Duration of Psychotic Symptoms:Increased in intensity over the past 2 weeks Past Diagnosis of Schizophrenia or Psychoactive disorder: No  Descriptions of Associations:Intact  Orientation:Full (Time, Place and Person)  Thought Content:Paranoid Ideation  Hallucinations:Hallucinations: None  Ideas of Reference:Paranoia  Suicidal Thoughts:Suicidal Thoughts: Yes, Passive SI Passive Intent and/or Plan: Without Intent; Without Plan; Without Means to Carry Out; Without Access to Means  Homicidal Thoughts:Homicidal Thoughts: No   Sensorium  Memory: Recent Fair; Remote Fair; Immediate Fair  Judgment: Impaired  Insight: Fair   Chartered certified accountant: Fair  Attention Span: Fair  Recall: [employer] of Knowledge: Fair  Language: Good   Psychomotor Activity  Psychomotor Activity: Psychomotor Activity: Normal   Assets  Assets: Desire for Improvement; Housing; Social Support; Vocational/Educational; Communication Skills   Sleep  Sleep: Sleep: Poor  Estimated Sleeping Duration (Last 24 Hours): 5.25-6.75 hours   Physical Exam: Physical Exam Vitals and nursing note reviewed.  HENT:     Head: Normocephalic.  Cardiovascular:     Rate and Rhythm: Normal rate.  Musculoskeletal:     Cervical back: Normal range of motion.  Neurological:     Mental Status: He is alert.    Review of Systems  Constitutional: Negative.   HENT: Negative.    Eyes: Negative.   Cardiovascular: Negative.   Skin: Negative.    Blood pressure 97/79, pulse (!) 113, temperature 98.4 F  (36.9 C), resp. rate 17, height 5' 8.5 (1.74 m), weight 95.9 kg, SpO2 98%. Body mass index is 31.69 kg/m.  Treatment Plan Summary: Daily contact with patient to assess and evaluate symptoms and progress in treatment, Medication management, and Plan will discuss initiation of SSRIs along with Seroquel  as a mood stabilizer to help with impulse control and anxiety.  Observation Level/Precautions:  15 minute checks  Laboratory:  CBC Chemistry Profile HbAIC UDS  Psychotherapy:    Medications:    Consultations:    Discharge Concerns:    Estimated LOS:  Other:     Physician Treatment Plan for Primary Diagnosis: MDD (major depressive disorder), recurrent, severe, with psychosis (HCC) Long Term Goal(s): Improvement in symptoms so as ready for discharge  Short Term Goals: Ability to identify changes in lifestyle to reduce recurrence of condition will improve, Ability to verbalize feelings will improve, Ability to demonstrate self-control will improve, and Ability to identify and develop effective coping behaviors will improve  Physician Treatment Plan for Secondary Diagnosis: Principal Problem:   MDD (major depressive disorder), recurrent, severe, with psychosis (HCC)  Long Term Goal(s): Improvement in symptoms so as ready for discharge  Short Term Goals: Ability to identify changes in lifestyle to reduce recurrence of condition will improve, Ability to verbalize feelings will improve, Ability to demonstrate self-control will improve, Ability to identify and develop effective coping behaviors will improve, and Ability to maintain clinical measurements within normal limits will improve  I certify that inpatient services furnished can reasonably be expected to improve the patient's condition.    Annie B Smith, NP 9/3/20252:29 PM

## 2023-10-20 NOTE — Progress Notes (Signed)
   10/20/23 1540  Spiritual Encounters  Type of Visit Initial  Care provided to: Patient  Referral source Chaplain assessment  Reason for visit Routine spiritual support   Following Spirituality Group, I introduced myself again to Mr. Nijee Heatwole. Mateusz shared that he had arrived last night on unit. I offered appreciation for his willingness to join group and let him know of ongoing spiritual care support. Jasier shared that his father was a long time [employer]. Will continue to follow and offer support as desired.  Shivani Barrantes L. Delores HERO.Div

## 2023-10-20 NOTE — Progress Notes (Signed)
 Pt admitted from [facility] under voluntary consent. Pt 58 y/o male admitted with anxiety and Depression. Pt arrived via safe transport. Pt AOx4. Patient has past medical Hx of MDD, GAD, DM II,and HTN. Pt calm and pleasant during assessment/admission. VSS. Pt ABCs intact. RR even and unlabored. Pt in NAD. No behavioral concers at this time pt guarded, minimal pleasant and cooperative on approach. Pt denies needs at this time. CBG 153. Pt ambulatory. Pt reports he is here because I have been feeling numb. I have been paranoid. I feel like I have been followed by the police and that I am going to lose my job. I have not been sleeping or eating. Pt denies SI/HI/AVH. PTA pt endorses passive SI. Pt verbally contracts for safety with this Clinical research associate and safety plan provided to pt. Anxiety 5/10. Depression 6/10. Skin was assessed and found to be clear of any abnormal marks, skin warm and dry. Patient was searched and no contraband found . Pt given education, support, and encouragement to be active in his treatment plan. Pt oriented to the unit and his room, given snack patient receptive. POC and unit policies explained and understanding verbalized. Personal belongings searched and allowed items given to pt. Items not allowed placed in storage room. Patient had no additional questions or concerns. Pt being monitored Q 15 minutes for safety per unit protocol, pt remains safe on the unit. Pt expressed goals are to rest and re balance. Work through my issues. POC.

## 2023-10-20 NOTE — Progress Notes (Signed)
   10/20/23 1100  Psych Admission Type (Psych Patients Only)  Admission Status Voluntary  Psychosocial Assessment  Patient Complaints Anxiety;Depression;Worrying  Eye Contact Brief  Facial Expression Anxious;Sad;Worried  Affect Anxious;Depressed;Sad  Speech Logical/coherent  Interaction Guarded;Minimal  Motor Activity Slow  Appearance/Hygiene Unremarkable  Behavior Characteristics Appropriate to situation  Mood Anxious;Sad;Pleasant  Thought Process  Coherency WDL  Content Preoccupation  Delusions None reported or observed  Perception WDL  Hallucination None reported or observed  Judgment Impaired  Confusion WDL  Danger to Self  Current suicidal ideation? Denies  Agreement Not to Harm Self Yes  Description of Agreement verbal  Danger to Others  Danger to Others None reported or observed

## 2023-10-20 NOTE — BHH Suicide Risk Assessment (Signed)
 [facility] Admission Suicide Risk Assessment   Nursing information obtained from:  Patient Demographic factors:  Male, Caucasian Current Mental Status:  NA Loss Factors:  Financial problems / change in socioeconomic status Historical Factors:  Anniversary of important loss Risk Reduction Factors:  Sense of responsibility to family, Employed, Living with another person, especially a relative, Positive social support, Positive therapeutic relationship, Positive coping skills or problem solving skills  Total Time spent with patient: 1 hour Principal Problem: MDD (major depressive disorder), recurrent, severe, with psychosis (HCC) Diagnosis:  Principal Problem:   MDD (major depressive disorder), recurrent, severe, with psychosis (HCC)  Subjective Data: Patient is 58 year old male admitted to the inpatient unit voluntarily. On admission, patient reported increased anxiety, depression and paranoia that has increased in intensity over the past two weeks. He also reported increased stressors including ongoing struggle with pornography addiction, recent death of father in law and his own father's physical health declining. He reported his addiction with pornography has been the worst it's ever been. He reported it initially started with clicking links on Facebook, but has progressed to him searching on the internet for the material. He stated that he uses his personal cellphone to look up pornographic material while at work now. 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 His paranoia has now increased and he reported feeling hypervigilant and worried that he is going to get arrested while at work for the above. He reported poor sleep, getting around 4 hours of sleep at home and not feeling well rested when he wakes up. He did report history of sleep apnea, but stated he did not use his CPAP machine anymore due to losing weight. He stated his wife has not informed him of any episodes of him  not breathing or gasping for air while he is asleep. His appetite is reported poor as well.   He endorsed feelings of guilt, hopelessness, fatigue, and loss of interest in hobbies. 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 He also endorsed feeling on edge, anxious, worrying constantly and thinking the worst in every situation. He denied any current or history of panic attacks. He denied mania symptoms including having too much energy, distractibility functioning with little to no sleep, & excessive spending.    He endorsed passive suicidal thoughts, such as What if I wasn't here?. He denied any plan or previous attempts. He denied HI, AH, VH, but did endorse increased paranoia, primarily related to getting in trouble for his addiction. He is not currently aligned with an outpatient mental health provider or services. Per patient report, he was admitted to an inpatient psychiatric facility around 2005, where he was started on seroquel  for a similar presentation. He reported he had some improvement in symptoms with seroquel . He is not currently on any psychiatric medications.   Continued Clinical Symptoms:  Alcohol Use Disorder Identification Test Final Score (AUDIT): 0 The Alcohol Use Disorders Identification Test, Guidelines for Use in Primary Care, Second Edition.  World Science writer [facility]). Score between 0-7:  no or low risk or alcohol related problems. Score between 8-15:  moderate risk of alcohol related problems. Score between 16-19:  high risk of alcohol related problems. Score 20 or above:  warrants further diagnostic evaluation for alcohol dependence and treatment.   CLINICAL FACTORS:   Depression:   Hopelessness   Musculoskeletal: Strength & Muscle Tone:  within normal limits Gait & Station: normal Patient leans: N/A  Psychiatric Specialty Exam:  Presentation  General Appearance:   Appropriate for Environment  Eye Contact: Fair  Speech: Clear and Coherent  Speech Volume: Decreased  Handedness: Right   Mood and Affect  Mood: Anxious; Depressed; Hopeless  Affect: Appropriate; Depressed   Thought Process  Thought Processes: Coherent; Linear; Goal Directed  Descriptions of Associations:Intact  Orientation:Full (Time, Place and Person)  Thought Content:Paranoid Ideation  History of Schizophrenia/Schizoaffective disorder:No  Duration of Psychotic Symptoms:Less than six months  Hallucinations:Hallucinations: None  Ideas of Reference:Paranoia  Suicidal Thoughts:Suicidal Thoughts: Yes, Passive SI Passive Intent and/or Plan: Without Intent; Without Plan; Without Means to Carry Out; Without Access to Means  Homicidal Thoughts:Homicidal Thoughts: No   Sensorium  Memory: Recent Fair; Remote Fair; Immediate Fair  Judgment: Impaired  Insight: Fair   Chartered certified accountant: Fair  Attention Span: Fair  Recall: [employer] of Knowledge: Fair  Language: Good   Psychomotor Activity  Psychomotor Activity: Psychomotor Activity: Normal   Assets  Assets: Desire for Improvement; Housing; Social Support; Vocational/Educational; Communication Skills   Sleep  Sleep: Sleep: Poor Number of Hours of Sleep: 4    Physical Exam: Physical Exam Constitutional:      Appearance: Normal appearance.  HENT:     Head: Normocephalic.  Pulmonary:     Effort: Pulmonary effort is normal.  Musculoskeletal:        General: Normal range of motion.  Neurological:     Mental Status: He is alert and oriented to person, place, and time.  Psychiatric:        Attention and Perception: Attention normal.        Mood and Affect: Mood is anxious and depressed.        Speech: Speech normal.        Behavior: Behavior normal.        Thought Content: Thought content is paranoid.        Cognition and Memory: Cognition normal.     Review of Systems  Constitutional:  Negative for fever.  Respiratory:  Negative for shortness of breath.   Cardiovascular:  Negative for chest pain.  Musculoskeletal:  Negative for myalgias.  Neurological:  Negative for dizziness and headaches.  Psychiatric/Behavioral:  Positive for depression. The patient is nervous/anxious and has insomnia.    Blood pressure 97/79, pulse (!) 113, temperature 98.4 F (36.9 C), resp. rate 17, height 5' 8.5 (1.74 m), weight 95.9 kg, SpO2 98%. Body mass index is 31.69 kg/m.   COGNITIVE FEATURES THAT CONTRIBUTE TO RISK:  None    SUICIDE RISK:   Mild:  Suicidal ideation of limited frequency, intensity, duration, and specificity.  There are no identifiable plans, no associated intent, mild dysphoria and related symptoms, good self-control (both objective and subjective assessment), few other risk factors, and identifiable protective factors, including available and accessible social support.  PLAN OF CARE: Patient gives permission to obtain collateral information from his wife. We will potentially add Seroquel  and SSRI to the regimen to target mood stabilization and address anxiety/depressive symptoms. Daily contact with patient while on the inpatient unit.  I certify that inpatient services furnished can reasonably be expected to improve the patient's condition.   Annie B Smith, NP 10/20/2023, 3:12 PM

## 2023-10-20 NOTE — Tx Team (Signed)
 Initial Treatment Plan 10/19/2023 2300 Phillip [name] FMW:[id number]    PATIENT STRESSORS: Financial difficulties   Loss of father in law   Marital or family conflict   Occupational concerns     PATIENT STRENGTHS: Ability for insight  Capable of independent living  Communication skills  Motivation for treatment/growth  Supportive family/friends  Work skills    PATIENT IDENTIFIED PROBLEMS: Depression  Anxiety  Delusions (paranoid)  Passive SI               DISCHARGE CRITERIA:  Ability to meet basic life and health needs Adequate post-discharge living arrangements Improved stabilization in mood, thinking, and/or behavior Medical problems require only outpatient monitoring Motivation to continue treatment in a less acute level of care Need for constant or close observation no longer present Reduction of life-threatening or endangering symptoms to within safe limits Safe-care adequate arrangements made Verbal commitment to aftercare and medication compliance  PRELIMINARY DISCHARGE PLAN: Outpatient therapy  PATIENT/FAMILY INVOLVEMENT: This treatment plan has been presented to and reviewed with the patient, Phillip [name].  The patient and family have been given the opportunity to ask questions and make suggestions.  Steward LITTIE Bracket, RN 10/20/2023, 8:03 AM

## 2023-10-20 NOTE — Plan of Care (Signed)
 Pt new to the unit, has not had time to progress.   Problem: Education: Goal: Knowledge of [facility] Education information/materials will improve Outcome: Progressing Goal: Emotional status will improve Outcome: Progressing Goal: Mental status will improve Outcome: Progressing Goal: Verbalization of understanding the information provided will improve Outcome: Progressing   Problem: Activity: Goal: Interest or engagement in activities will improve Outcome: Progressing Goal: Sleeping patterns will improve Outcome: Progressing   Problem: Coping: Goal: Ability to verbalize frustrations and anger appropriately will improve Outcome: Progressing Goal: Ability to demonstrate self-control will improve Outcome: Progressing   Problem: Health Behavior/Discharge Planning: Goal: Identification of resources available to assist in meeting health care needs will improve Outcome: Progressing Goal: Compliance with treatment plan for underlying cause of condition will improve Outcome: Progressing   Problem: Physical Regulation: Goal: Ability to maintain clinical measurements within normal limits will improve Outcome: Progressing   Problem: Safety: Goal: Periods of time without injury will increase Outcome: Progressing   Problem: Education: Goal: Ability to state activities that reduce stress will improve Outcome: Progressing   Problem: Coping: Goal: Ability to identify and develop effective coping behavior will improve Outcome: Progressing   Problem: Self-Concept: Goal: Ability to identify factors that promote anxiety will improve Outcome: Progressing Goal: Level of anxiety will decrease Outcome: Progressing Goal: Ability to modify response to factors that promote anxiety will improve Outcome: Progressing   Problem: Education: Goal: Utilization of techniques to improve thought processes will improve Outcome: Progressing Goal: Knowledge of the prescribed therapeutic  regimen will improve Outcome: Progressing   Problem: Activity: Goal: Interest or engagement in leisure activities will improve Outcome: Progressing Goal: Imbalance in normal sleep/wake cycle will improve Outcome: Progressing   Problem: Coping: Goal: Coping ability will improve Outcome: Progressing Goal: Will verbalize feelings Outcome: Progressing   Problem: Health Behavior/Discharge Planning: Goal: Ability to make decisions will improve Outcome: Progressing Goal: Compliance with therapeutic regimen will improve Outcome: Progressing   Problem: Role Relationship: Goal: Will demonstrate positive changes in social behaviors and relationships Outcome: Progressing   Problem: Safety: Goal: Ability to disclose and discuss suicidal ideas will improve Outcome: Progressing Goal: Ability to identify and utilize support systems that promote safety will improve Outcome: Progressing   Problem: Self-Concept: Goal: Will verbalize positive feelings about self Outcome: Progressing Goal: Level of anxiety will decrease Outcome: Progressing   Problem: Education: Goal: Ability to make informed decisions regarding treatment will improve Outcome: Progressing   Problem: Coping: Goal: Coping ability will improve Outcome: Progressing   Problem: Health Behavior/Discharge Planning: Goal: Identification of resources available to assist in meeting health care needs will improve Outcome: Progressing   Problem: Medication: Goal: Compliance with prescribed medication regimen will improve Outcome: Progressing   Problem: Self-Concept: Goal: Ability to disclose and discuss suicidal ideas will improve Outcome: Progressing Goal: Will verbalize positive feelings about self Outcome: Progressing Note: Patient is initiating therapy. Patient will be evaluated at upcoming provider appointment to assess progress

## 2023-10-20 NOTE — Progress Notes (Signed)
 NUTRITION ASSESSMENT  Pt identified as at risk on the Malnutrition Screen Tool  INTERVENTION:  -Continue carb modified diet -MVI with minerals daily  NUTRITION DIAGNOSIS: Unintentional weight loss related to sub-optimal intake as evidenced by pt report.   Goal: Pt to meet >/= 90% of their estimated nutrition needs.  Monitor:  PO intake  Assessment:  Pt with history of DM admitted with MDD.  Pt currently on a carb modified diet. No meal completion data available to assess at this time.   Per CareEverywhere, pt was 226# on 05/19/23 encounter. Pt has experienced a 6.9% wt loss over the past 5 months, which is not significant for time frame. Per PCP notes, pt was started on ozempic in 05/2021; has no GI issues, but has had steady weight loss related to medication.  Lab Results  Component Value Date   HGBA1C 6.0 (H) 10/19/2023   PTA DM medications are 1000 mg metformin  BID.   Labs reviewed: CBGS: 153-184 (inpatient orders for glycemic control are 1000 mg metformin  BID).    58 y.o. male  Height: Ht Readings from Last 1 Encounters:  09/08/19 5' 8 (1.727 m)    Weight: Wt Readings from Last 1 Encounters:  10/19/23 95.9 kg    Weight Hx: Wt Readings from Last 10 Encounters:  10/19/23 95.9 kg  09/08/19 (!) 106 kg    BMI:  Body mass index is 32.16 kg/m. Pt meets criteria for obesity, class I based on current BMI. Obesity is a complex, chronic medical condition that is optimally managed by a multidisciplinary care team. Weight loss is not an ideal goal for an acute inpatient hospitalization. However, if further work-up for obesity is warranted, consider outpatient referral to [facility].    Estimated Nutritional Needs: Kcal: 25-30 kcal/kg Protein: > 1 gram protein/kg Fluid: 1 ml/kcal  Diet Order:  Diet Order             Diet Carb Modified Fluid consistency: Thin; Room service appropriate? Yes  Diet effective now                   Pt is also offered choice of unit snacks mid-morning and mid-afternoon.  Pt is eating as desired.   Lab results and medications reviewed.   Margery ORN, RD, LDN, CDCES Registered Dietitian III Certified Diabetes Care and Education Specialist If unable to reach this RD, please use RD Inpatient group chat on secure chat between hours of 8am-4 pm daily

## 2023-10-20 NOTE — BH IP Treatment Plan (Signed)
 Interdisciplinary Treatment and Diagnostic Plan Update  10/20/2023 Time of Session: 9:51 AM  Phillip [name] MRN: [id number]  Principal Diagnosis: MDD (major depressive disorder), recurrent, severe, with psychosis (HCC)  Secondary Diagnoses: Principal Problem:   MDD (major depressive disorder), recurrent, severe, with psychosis (HCC)   Current Medications:  Current Facility-Administered Medications  Medication Dose Route Frequency Provider Last Rate Last Admin   acetaminophen  (TYLENOL ) tablet 650 mg  650 mg Oral Q6H PRN White, Patrice L, NP       alum & mag hydroxide-simeth (MAALOX/MYLANTA) 200-200-20 MG/5ML suspension 30 mL  30 mL Oral Q4H PRN White, Patrice L, NP       hydrOXYzine  (ATARAX ) tablet 25 mg  25 mg Oral TID PRN White, Patrice L, NP       irbesartan  (AVAPRO ) tablet 150 mg  150 mg Oral Daily White, Patrice L, NP   150 mg at 10/20/23 9076   magnesium  hydroxide (MILK OF MAGNESIA) suspension 30 mL  30 mL Oral Daily PRN White, Patrice L, NP       metFORMIN  (GLUCOPHAGE -XR) 24 hr tablet 1,000 mg  1,000 mg Oral BID WC White, Patrice L, NP   1,000 mg at 10/20/23 9076   multivitamin with minerals tablet 1 tablet  1 tablet Oral Daily Jadapalle, Sree, MD   1 tablet at 10/20/23 9076   OLANZapine  (ZYPREXA ) injection 5 mg  5 mg Intramuscular TID PRN White, Patrice L, NP       OLANZapine  zydis (ZYPREXA ) disintegrating tablet 5 mg  5 mg Oral TID PRN White, Patrice L, NP       traZODone  (DESYREL ) tablet 50 mg  50 mg Oral QHS PRN White, Patrice L, NP   50 mg at 10/19/23 2245   PTA Medications: Medications Prior to Admission  Medication Sig Dispense Refill Last Dose/Taking   irbesartan  (AVAPRO ) 150 MG tablet Take 1 tablet (150 mg total) by mouth daily.      metFORMIN  (FORTAMET ) 1000 MG (OSM) 24 hr tablet Take 1 tablet (1,000 mg total) by mouth 2 (two) times daily with a meal.       Patient Stressors: Financial difficulties   Loss of father in law   Marital or family conflict   Occupational  concerns    Patient Strengths: Ability for insight  Capable of independent living  Manufacturing systems engineer  Motivation for treatment/growth  Supportive family/friends  Work skills   Treatment Modalities: Medication Management, Group therapy, Case management,  1 to 1 session with clinician, Psychoeducation, Recreational therapy.   Physician Treatment Plan for Primary Diagnosis: MDD (major depressive disorder), recurrent, severe, with psychosis (HCC) Long Term Goal(s):     Short Term Goals:    Medication Management: Evaluate patient's response, side effects, and tolerance of medication regimen.  Therapeutic Interventions: 1 to 1 sessions, Unit Group sessions and Medication administration.  Evaluation of Outcomes: Not Progressing  Physician Treatment Plan for Secondary Diagnosis: Principal Problem:   MDD (major depressive disorder), recurrent, severe, with psychosis (HCC)  Long Term Goal(s):     Short Term Goals:       Medication Management: Evaluate patient's response, side effects, and tolerance of medication regimen.  Therapeutic Interventions: 1 to 1 sessions, Unit Group sessions and Medication administration.  Evaluation of Outcomes: Not Progressing   RN Treatment Plan for Primary Diagnosis: MDD (major depressive disorder), recurrent, severe, with psychosis (HCC) Long Term Goal(s): Knowledge of disease and therapeutic regimen to maintain health will improve  Short Term Goals: Ability to remain free from injury  will improve, Ability to verbalize frustration and anger appropriately will improve, Ability to demonstrate self-control, Ability to participate in decision making will improve, Ability to verbalize feelings will improve, Ability to disclose and discuss suicidal ideas, Ability to identify and develop effective coping behaviors will improve, and Compliance with prescribed medications will improve  Medication Management: RN will administer medications as ordered by  provider, will assess and evaluate patient's response and provide education to patient for prescribed medication. RN will report any adverse and/or side effects to prescribing provider.  Therapeutic Interventions: 1 on 1 counseling sessions, Psychoeducation, Medication administration, Evaluate responses to treatment, Monitor vital signs and CBGs as ordered, Perform/monitor CIWA, COWS, AIMS and Fall Risk screenings as ordered, Perform wound care treatments as ordered.  Evaluation of Outcomes: Not Progressing   LCSW Treatment Plan for Primary Diagnosis: MDD (major depressive disorder), recurrent, severe, with psychosis (HCC) Long Term Goal(s): Safe transition to appropriate next level of care at discharge, Engage patient in therapeutic group addressing interpersonal concerns.  Short Term Goals: Engage patient in aftercare planning with referrals and resources, Increase social support, Increase ability to appropriately verbalize feelings, Increase emotional regulation, Facilitate acceptance of mental health diagnosis and concerns, Facilitate patient progression through stages of change regarding substance use diagnoses and concerns, Identify triggers associated with mental health/substance abuse issues, and Increase skills for wellness and recovery  Therapeutic Interventions: Assess for all discharge needs, 1 to 1 time with Social worker, Explore available resources and support systems, Assess for adequacy in community support network, Educate family and significant other(s) on suicide prevention, Complete Psychosocial Assessment, Interpersonal group therapy.  Evaluation of Outcomes: Not Progressing   Progress in Treatment: Attending groups: No. Participating in groups: No. Taking medication as prescribed: Yes. Toleration medication: Yes. Family/Significant other contact made: No, will contact:  CSW will contact if given permission  Patient understands diagnosis: Yes. Discussing patient  identified problems/goals with staff: Yes. Medical problems stabilized or resolved: Yes. Denies suicidal/homicidal ideation: Yes. Issues/concerns per patient self-inventory: No. Other: None   New problem(s) identified: No, Describe:  None identified   New Short Term/Long Term Goal(s):  elimination of symptoms of psychosis, medication management for mood stabilization; elimination of SI thoughts; development of comprehensive mental wellness plan.   Patient Goals:  I'm just trying to get a sense of balance, just anxiety and depression  Discharge Plan or Barriers: CSW will assist with appropriate discharge planning   Reason for Continuation of Hospitalization: Anxiety Depression Medication stabilization  Estimated Length of Stay: 1 to 7 days   Last 3 [place] Suicide Severity Risk Score: Flowsheet Row Admission (Current) from 10/19/2023 in [facility] Most recent reading at 10/19/2023 11:00 PM ED from 10/19/2023 in [facility] Most recent reading at 10/19/2023  6:24 PM  C-SSRS RISK CATEGORY Error: Q3, 4, or 5 should not be populated when Q2 is No No Risk    Last PHQ 2/9 Scores:     No data to display          Scribe for Treatment Team: Lum JONETTA Croft, LCSWA 10/20/2023 10:21 AM

## 2023-10-20 NOTE — Plan of Care (Signed)
   Problem: Education: Goal: Knowledge of [facility] Education information/materials will improve Outcome: Progressing Goal: Emotional status will improve Outcome: Progressing Goal: Mental status will improve Outcome: Progressing Goal: Verbalization of understanding the information provided will improve Outcome: Progressing

## 2023-10-20 NOTE — Group Note (Signed)
 Date:  10/20/2023 Time:  11:17 AM  Group Topic/Focus:  Personal Choices and Values:   The focus of this group is to help patients assess and explore the importance of values in their lives, how their values affect their decisions, how they express their values and what opposes their expression.    Participation Level:  Active  Participation Quality:  Appropriate  Affect:  Appropriate  Cognitive:  Appropriate  Insight: Good  Engagement in Group:  Limited  Modes of Intervention:  Discussion  Additional Comments:  N/A  Harlene LITTIE Gavel 10/20/2023, 11:17 AM

## 2023-10-20 NOTE — BHH Group Notes (Signed)
 Spirituality Group   Group Goal: Support / Education around grief and loss    Group Description: Following introductions and group rules, group members engaged in facilitated group dialog and support around topic of loss, with particular support around experiences of loss in their lives. Group members identified types of loss (relationships / self / things) as well as patterns, circumstances, and changes that precipitate loss. Reflection invited on thoughts / feelings around loss, normalized grief responses, and recognized variety in grief experience. Group noted Worden's four tasks of grief in discussion. Group drew on Adlerian / Rogerian, narrative, MI, with [place]'s group therapy as a primary framework.   Observations: Phillip [name] joined the group for the latter .75 hour. He was reserved and quiet but pasively engaged in the group discussion. This chaplain made a brief follow-up after group to check-in.  Brodee Mauritz L. Delores HERO.Div

## 2023-10-20 NOTE — Progress Notes (Signed)
   10/19/23 2300  Psych Admission Type (Psych Patients Only)  Admission Status Voluntary  Psychosocial Assessment  Patient Complaints Anxiety;Depression  Eye Contact Brief  Facial Expression Sad  Affect Appropriate to circumstance;Sad  Speech Logical/coherent;Soft  Interaction Guarded;Minimal  Motor Activity Slow (steady)  Appearance/Hygiene In scrubs  Behavior Characteristics Appropriate to situation;Cooperative;Calm  Mood Sad;Pleasant  Thought Process  Coherency WDL  Content Paranoia  Delusions Paranoid  Perception WDL  Hallucination None reported or observed  Judgment WDL  Confusion WDL  Danger to Self  Current suicidal ideation? Denies  Agreement Not to Harm Self Yes  Description of Agreement verbal  Danger to Others  Danger to Others None reported or observed

## 2023-10-21 DIAGNOSIS — F333 Major depressive disorder, recurrent, severe with psychotic symptoms: Secondary | ICD-10-CM | POA: Diagnosis not present

## 2023-10-21 MED ORDER — QUETIAPINE FUMARATE 25 MG PO TABS
50.0000 mg | ORAL_TABLET | Freq: Every day | ORAL | Status: DC
  Administered 2023-10-21 – 2023-10-24 (×4): 50 mg via ORAL
  Filled 2023-10-21 (×5): qty 2

## 2023-10-21 MED ORDER — FLUOXETINE HCL 10 MG PO CAPS
10.0000 mg | ORAL_CAPSULE | Freq: Every day | ORAL | Status: DC
  Administered 2023-10-21 – 2023-10-22 (×2): 10 mg via ORAL
  Filled 2023-10-21 (×2): qty 1

## 2023-10-21 NOTE — Group Note (Signed)
 Date:  10/21/2023 Time:  10:53 PM  Group Topic/Focus:  Self Care:   The focus of this group is to help patients understand the importance of self-care in order to improve or restore emotional, physical, spiritual, interpersonal, and financial health.    Participation Level:  Did Not Attend  Participation Quality:  Did Not Attend  Affect:  Did Not Attend  Cognitive:  Did Not Attend  Insight: None  Engagement in Group:  None  Modes of Intervention:  Did Not Attend  Additional Comments:    Phillip [name] 10/21/2023, 10:53 PM

## 2023-10-21 NOTE — Plan of Care (Signed)
  Problem: Activity: Goal: Interest or engagement in activities will improve Outcome: Progressing   Problem: Coping: Goal: Ability to demonstrate self-control will improve Outcome: Progressing   Problem: Health Behavior/Discharge Planning: Goal: Compliance with treatment plan for underlying cause of condition will improve Outcome: Progressing   

## 2023-10-21 NOTE — Group Note (Signed)
 LCSW Group Therapy Note  Group Date: 10/21/2023 Start Time: 1300 End Time: 1345   Type of Therapy and Topic:  Group Therapy: Positive Affirmations  Participation Level:  Minimal   Description of Group:   This group addressed positive affirmation towards self and others.  Patients went around the room and identified two positive things about themselves and two positive things about a peer in the room.  Patients reflected on how it felt to share something positive with others, to identify positive things about themselves, and to hear positive things from others/ Patients were encouraged to have a daily reflection of positive characteristics or circumstances.   Therapeutic Goals: Patients will verbalize two of their positive qualities Patients will demonstrate empathy for others by stating two positive qualities about a peer in the group Patients will verbalize their feelings when voicing positive self affirmations and when voicing positive affirmations of others Patients will discuss the potential positive impact on their wellness/recovery of focusing on positive traits of self and others.  Summary of Patient Progress:  Phillip [name] actively engaged in the discussion and . He was able to identify positive affirmations about himself as well as other group members. Patient demonstrated fair insight into the subject matter, was respectful of peers, participated throughout the entire session.  Therapeutic Modalities:   Cognitive Behavioral Therapy Motivational Interviewing    Lum Phillip [name], [place] 10/21/2023  2:09 PM

## 2023-10-21 NOTE — Group Note (Signed)
 Date:  10/21/2023 Time:  5:14 AM  Group Topic/Focus:  Dimensions of Wellness:   The focus of this group is to introduce the topic of wellness and discuss the role each dimension of wellness plays in total health. Wrap-Up Group:   The focus of this group is to help patients review their daily goal of treatment and discuss progress on daily workbooks. Video Meditation: The focus of this group was to introduce meditation for beginners and demonstrate how to surround their day with positive thinking leading with daily affirmations.    Participation Level:  Minimal  Participation Quality:  Appropriate  Affect:  Appropriate  Cognitive:  Appropriate  Insight: Appropriate  Engagement in Group:  Limited  Modes of Intervention:  Discussion and Education  Additional Comments:    Kristen VEAR Gibbon 10/21/2023, 5:14 AM

## 2023-10-21 NOTE — Progress Notes (Signed)
   10/20/23 2030  Psych Admission Type (Psych Patients Only)  Admission Status Voluntary  Psychosocial Assessment  Patient Complaints Anxiety;Depression  Eye Contact Brief  Facial Expression Anxious;Worried  Affect Anxious;Depressed  Advertising account executive Activity Slow  Appearance/Hygiene Unremarkable  Behavior Characteristics Appropriate to situation  Mood Anxious;Sad;Pleasant  Thought Process  Coherency WDL  Content Preoccupation;Paranoia  Delusions None reported or observed  Perception WDL  Hallucination None reported or observed  Judgment Impaired  Confusion WDL  Danger to Self  Current suicidal ideation? Denies  Agreement Not to Harm Self Yes  Description of Agreement verbal  Danger to Others  Danger to Others None reported or observed

## 2023-10-21 NOTE — Group Note (Signed)
 Date:  10/21/2023 Time:  11:14 AM  Group Topic/Focus:  Fresh air Therapy with music and cards    Participation Level:  Active  Participation Quality:  Appropriate  Affect:  Appropriate  Cognitive:  Appropriate  Insight: Appropriate  Engagement in Group:  Engaged  Modes of Intervention:  Activity and Socialization  Additional Comments:  none   Norleen SHAUNNA Bias 10/21/2023, 11:14 AM

## 2023-10-21 NOTE — Progress Notes (Signed)
 Mood/ behavior: Pleasant and cooperative. Flat affect.  Restless.  Endorses/ denies: Endorses anxiety.  Denies SI/HI and AVH.    Pain: Denies  Medication/ PRNs: Compliant with scheduled medications.  Prozac  started today.   Interaction / group attendance:  Present in milieu and attends groups.  Minimal interaction with peers and staff.   15 min checks in place for safety.

## 2023-10-21 NOTE — Progress Notes (Signed)
 [facility] MD Progress Note  10/21/2023 10:27 PM Phillip [name]  MRN:  [id number]  Patient is 58 year old male admitted to the inpatient unit voluntarily. On admission, patient reported increased anxiety, depression and paranoia that has increased in intensity over the past two weeks. He also reported increased stressors including ongoing struggle with pornography addiction, recent death of father in law and his own father's physical health declining. He reported his addiction with pornography has been the worst it's ever been. He reported it initially started with clicking links on Facebook, but has progressed to him searching on the internet for the material Subjective:  Chart reviewed, case discussed in multidisciplinary meeting, patient seen during rounds.  On interview patient reports having extreme anxiety today.  Patient and provider discussed the medication option of getting him on Prozac  10 mg daily and Seroquel  50 mg nightly to help with anxiety and mood stabilization.  Patient reports that he could not sleep well last night.  Patient reports feeling anxious about his family.  He denies SI/HI/plan and denies hallucinations.   Sleep: Fair  Appetite:  Fair  Past Psychiatric History: see h&P Family History:  Family History  Problem Relation Age of Onset   Diabetes Mother    Diabetes Father    Heart failure Father    Schizophrenia Paternal Grandmother    Social History:  Social History   Substance and Sexual Activity  Alcohol Use Never     Social History   Substance and Sexual Activity  Drug Use Never    Social History   Socioeconomic History   Marital status: Married    Spouse name: Not on file   Number of children: Not on file   Years of education: Not on file   Highest education level: Not on file  Occupational History   Not on file  Tobacco Use   Smoking status: Never   Smokeless tobacco: Never  Vaping Use   Vaping status: Never Used  Substance and Sexual Activity    Alcohol use: Never   Drug use: Never   Sexual activity: Not on file  Other Topics Concern   Not on file  Social History Narrative   Not on file   Social Drivers of Health   Financial Resource Strain: Low Risk  (03/16/2021)   Received from [facility] prior to [facility]18/[facility]   Overall Financial Resource Strain (CARDIA)    Difficulty of Paying Living Expenses: Not very hard  Food Insecurity: No Food Insecurity (10/19/2023)   Hunger Vital Sign    Worried About Running Out of Food in the Last Year: Never true    Ran Out of Food in the Last Year: Never true  Transportation Needs: No Transportation Needs (10/19/2023)   PRAPARE - Administrator, Civil Service (Medical): No    Lack of Transportation (Non-Medical): No  Physical Activity: Inactive (03/16/2021)   Received from [facility] visits prior to [facility]/18/2022., [facility]   Exercise Vital Sign    On average, how many days per week do you engage in moderate to strenuous exercise (like a brisk walk)?: 0 days    On average, how many minutes do you engage in exercise at this level?: 0 min  Stress: Stress Concern Present (03/16/2021)   Received from [facility] visits prior to 04/18/2022., [facility]   [employer] of Occupational Health - Occupational Stress Questionnaire    Feeling of Stress :  To some extent  Social Connections: Moderately Integrated (10/19/2023)   Social Connection and Isolation Panel    Frequency of Communication with Friends and Family: Once a week    Frequency of Social Gatherings with Friends and Family: Twice a week    Attends Religious Services: 1 to 4 times per year    Active Member of [employer] or Organizations: No    Attends Engineer, structural: Never    Marital Status: Married   Past Medical History:  Past Medical History:  Diagnosis Date   Anxiety    Borderline diabetic    Hypertension    Pre-diabetes      Past Surgical History:  Procedure Laterality Date   HERNIA REPAIR      Current Medications: Current Facility-Administered Medications  Medication Dose Route Frequency Provider Last Rate Last Admin   acetaminophen  (TYLENOL ) tablet 650 mg  650 mg Oral Q6H PRN White, Patrice L, NP       alum & mag hydroxide-simeth (MAALOX/MYLANTA) 200-200-20 MG/5ML suspension 30 mL  30 mL Oral Q4H PRN White, Patrice L, NP       FLUoxetine  (PROZAC ) capsule 10 mg  10 mg Oral Daily Arul Farabee, MD   10 mg at 10/21/23 1356   hydrOXYzine  (ATARAX ) tablet 25 mg  25 mg Oral TID PRN White, Patrice L, NP       irbesartan  (AVAPRO ) tablet 150 mg  150 mg Oral Daily White, Patrice L, NP   150 mg at 10/21/23 9071   magnesium  hydroxide (MILK OF MAGNESIA) suspension 30 mL  30 mL Oral Daily PRN White, Patrice L, NP       metFORMIN  (GLUCOPHAGE -XR) 24 hr tablet 1,000 mg  1,000 mg Oral BID WC White, Patrice L, NP   1,000 mg at 10/21/23 1640   multivitamin with minerals tablet 1 tablet  1 tablet Oral Daily Kristine Tiley, MD   1 tablet at 10/21/23 9071   OLANZapine  (ZYPREXA ) injection 5 mg  5 mg Intramuscular TID PRN White, Patrice L, NP       OLANZapine  zydis (ZYPREXA ) disintegrating tablet 5 mg  5 mg Oral TID PRN White, Patrice L, NP       QUEtiapine  (SEROQUEL ) tablet 50 mg  50 mg Oral QHS Raney Koeppen, MD   50 mg at 10/21/23 2146   traZODone  (DESYREL ) tablet 50 mg  50 mg Oral QHS PRN White, Patrice L, NP   50 mg at 10/20/23 2221    Lab Results:  Results for orders placed or performed during the hospital encounter of 10/19/23 (from the past 48 hours)  Glucose, capillary     Status: Abnormal   Collection Time: 10/19/23 10:59 PM  Result Value Ref Range   Glucose-Capillary 153 (H) 70 - 99 mg/dL    Comment: Glucose reference range applies only to samples taken after fasting for at least 8 hours.  Glucose, capillary     Status: Abnormal   Collection Time: 10/20/23  7:21 AM  Result Value Ref Range    Glucose-Capillary 184 (H) 70 - 99 mg/dL    Comment: Glucose reference range applies only to samples taken after fasting for at least 8 hours.    Blood Alcohol level:  Lab Results  Component Value Date   [facility] <15 10/19/2023    Metabolic Disorder Labs: Lab Results  Component Value Date   HGBA1C 6.0 (H) 10/19/2023   MPG 125.5 10/19/2023   No results found for: PROLACTIN Lab Results  Component Value Date   CHOL  172 10/19/2023   TRIG 127 10/19/2023   HDL 52 10/19/2023   CHOLHDL 3.3 10/19/2023   VLDL 25 10/19/2023   LDLCALC 95 10/19/2023    Physical Findings: AIMS:  , ,  ,  ,    CIWA:    COWS:      Psychiatric Specialty Exam:  Presentation  General Appearance:  Appropriate for Environment  Eye Contact: Fair  Speech: Clear and Coherent  Speech Volume: Decreased    Mood and Affect  Mood: Anxious; Depressed; Hopeless  Affect: Appropriate; Depressed   Thought Process  Thought Processes: Coherent; Linear; Goal Directed  Descriptions of Associations:Intact  Orientation:Full (Time, Place and Person)  Thought Content:Paranoid Ideation  Hallucinations:Hallucinations: None  Ideas of Reference:Paranoia  Suicidal Thoughts:Suicidal Thoughts: Yes, Passive SI Passive Intent and/or Plan: Without Intent; Without Plan; Without Means to Carry Out; Without Access to Means  Homicidal Thoughts:Homicidal Thoughts: No   Sensorium  Memory: Recent Fair; Remote Fair; Immediate Fair  Judgment: Impaired  Insight: Fair   Chartered certified accountant: Fair  Attention Span: Fair  Recall: [employer] of Knowledge: Fair  Language: Good   Psychomotor Activity  Psychomotor Activity: Psychomotor Activity: Normal  Musculoskeletal: Strength & Muscle Tone: within normal limits Gait & Station: normal Assets  Assets: Desire for Improvement; Housing; Social Support; Vocational/Educational; Manufacturing systems engineer    Physical Exam: Physical  Exam ROS Blood pressure 95/81, pulse 99, temperature 98.4 F (36.9 C), resp. rate 16, height 5' 8.5 (1.74 m), weight 95.9 kg, SpO2 98%. Body mass index is 31.69 kg/m.  Diagnosis: Principal Problem:   MDD (major depressive disorder), recurrent, severe, with psychosis (HCC)   PLAN: Safety and Monitoring:  -- Voluntary admission to inpatient psychiatric unit for safety, stabilization and treatment  -- Daily contact with patient to assess and evaluate symptoms and progress in treatment  -- Patient's case to be discussed in multi-disciplinary team meeting  -- Observation Level : q15 minute checks  -- Vital signs:  q12 hours  -- Precautions: suicide, elopement, and assault -- Encouraged patient to participate in unit milieu and in scheduled group therapies  2. Psychiatric Diagnoses and Treatment:  Prozac  10 mg daily and Seroquel  50 mg nightly       3. Medical Issues Being Addressed:     4. Discharge Planning:   -- Social work and case management to assist with discharge planning and identification of hospital follow-up needs prior to discharge  -- Estimated LOS: 3-4 days  Phillip Petta, MD 10/21/2023, 10:27 PM

## 2023-10-21 NOTE — Plan of Care (Signed)
  Problem: Education: Goal: Knowledge of [facility] Education information/materials will improve Outcome: Progressing Goal: Emotional status will improve Outcome: Progressing Goal: Mental status will improve Outcome: Progressing Goal: Verbalization of understanding the information provided will improve Outcome: Progressing   Problem: Activity: Goal: Interest or engagement in activities will improve Outcome: Progressing Goal: Sleeping patterns will improve Outcome: Progressing   Problem: Coping: Goal: Ability to verbalize frustrations and anger appropriately will improve Outcome: Progressing Goal: Ability to demonstrate self-control will improve Outcome: Progressing   Problem: Health Behavior/Discharge Planning: Goal: Identification of resources available to assist in meeting health care needs will improve Outcome: Progressing Goal: Compliance with treatment plan for underlying cause of condition will improve Outcome: Progressing   Problem: Physical Regulation: Goal: Ability to maintain clinical measurements within normal limits will improve Outcome: Progressing   Problem: Safety: Goal: Periods of time without injury will increase Outcome: Progressing   Problem: Education: Goal: Ability to state activities that reduce stress will improve Outcome: Progressing   Problem: Coping: Goal: Ability to identify and develop effective coping behavior will improve Outcome: Progressing   Problem: Self-Concept: Goal: Ability to identify factors that promote anxiety will improve Outcome: Progressing Goal: Level of anxiety will decrease Outcome: Progressing Goal: Ability to modify response to factors that promote anxiety will improve Outcome: Progressing   Problem: Education: Goal: Utilization of techniques to improve thought processes will improve Outcome: Progressing Goal: Knowledge of the prescribed therapeutic regimen will improve Outcome: Progressing   Problem:  Activity: Goal: Interest or engagement in leisure activities will improve Outcome: Progressing Goal: Imbalance in normal sleep/wake cycle will improve Outcome: Progressing   Problem: Coping: Goal: Coping ability will improve Outcome: Progressing Goal: Will verbalize feelings Outcome: Progressing   Problem: Health Behavior/Discharge Planning: Goal: Ability to make decisions will improve Outcome: Progressing Goal: Compliance with therapeutic regimen will improve Outcome: Progressing   Problem: Role Relationship: Goal: Will demonstrate positive changes in social behaviors and relationships Outcome: Progressing   Problem: Safety: Goal: Ability to disclose and discuss suicidal ideas will improve Outcome: Progressing Goal: Ability to identify and utilize support systems that promote safety will improve Outcome: Progressing   Problem: Self-Concept: Goal: Will verbalize positive feelings about self Outcome: Progressing Goal: Level of anxiety will decrease Outcome: Progressing   Problem: Education: Goal: Ability to make informed decisions regarding treatment will improve Outcome: Progressing   Problem: Coping: Goal: Coping ability will improve Outcome: Progressing   Problem: Health Behavior/Discharge Planning: Goal: Identification of resources available to assist in meeting health care needs will improve Outcome: Progressing   Problem: Medication: Goal: Compliance with prescribed medication regimen will improve Outcome: Progressing   Problem: Self-Concept: Goal: Ability to disclose and discuss suicidal ideas will improve Outcome: Progressing Goal: Will verbalize positive feelings about self Outcome: Progressing   

## 2023-10-21 NOTE — Progress Notes (Signed)
   10/21/23 2100  Psych Admission Type (Psych Patients Only)  Admission Status Voluntary  Psychosocial Assessment  Patient Complaints Depression  Eye Contact Brief  Facial Expression Flat  Affect Preoccupied;Sad  Speech Logical/coherent;Slow  Interaction Minimal  Motor Activity Slow  Appearance/Hygiene Unremarkable  Behavior Characteristics Appropriate to situation;Cooperative  Mood Other (Comment) (numb)  Thought Process  Coherency WDL  Content WDL  Delusions None reported or observed  Perception WDL  Hallucination None reported or observed  Judgment Impaired  Confusion Mild  Danger to Self  Current suicidal ideation? Denies  Agreement Not to Harm Self Yes  Description of Agreement verbal  Danger to Others  Danger to Others None reported or observed

## 2023-10-21 NOTE — Group Note (Signed)
 Recreation Therapy Group Note   Group Topic:Goal Setting  Group Date: 10/21/2023 Start Time: 1510 End Time: 1550 Facilitators: Celestia Jeoffrey BRAVO, LRT, CTRS Location: Dayroom  Group Description: Product/process development scientist. Patients were given many different magazines, a glue stick, markers, and a piece of cardstock paper. LRT and pts discussed the importance of having goals in life. LRT and pts discussed the difference between short-term and long-term goals, as well as what a SMART goal is. LRT encouraged pts to create a vision board, with images they picked and then cut out with safety scissors from the magazine, for themselves, that capture their short and long-term goals. LRT encouraged pts to show and explain their vision board to the group.   Goal Area(s) Addressed:  Patient will gain knowledge of short vs. long term goals.  Patient will identify goals for themselves. Patient will practice setting SMART goals. Patient will verbalize their goals to LRT and peers.   Affect/Mood: Appropriate and Flat   Participation Level: Active and Engaged   Participation Quality: Independent   Behavior: Appropriate, Calm, and Cooperative   Speech/Thought Process: Coherent   Insight: Fair   Judgement: Fair    Modes of Intervention: Art, Education, and Exploration   Patient Response to Interventions:  Receptive   Education Outcome:  Acknowledges education   Clinical Observations/Individualized Feedback: Phillip [name] was active in their participation of session activities and group discussion. Pt identified my goal is to go home. 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 Pt appropriately identified images to reflect his goal.    Plan: Continue to engage patient in RT group sessions 2-3x/week.   Jeoffrey BRAVO Celestia, LRT, CTRS 10/21/2023 5:04 PM

## 2023-10-22 DIAGNOSIS — F333 Major depressive disorder, recurrent, severe with psychotic symptoms: Secondary | ICD-10-CM | POA: Diagnosis not present

## 2023-10-22 MED ORDER — FLUOXETINE HCL 20 MG PO CAPS
20.0000 mg | ORAL_CAPSULE | Freq: Every day | ORAL | Status: DC
  Administered 2023-10-23 – 2023-10-25 (×3): 20 mg via ORAL
  Filled 2023-10-22 (×3): qty 1

## 2023-10-22 MED ORDER — ENSURE PLUS HIGH PROTEIN PO LIQD: 237.0000 mL | Freq: Two times a day (BID) | ORAL | Status: DC

## 2023-10-22 NOTE — Plan of Care (Signed)
   Problem: Education: Goal: Knowledge of [facility] Education information/materials will improve Outcome: Progressing Goal: Emotional status will improve Outcome: Progressing Goal: Mental status will improve Outcome: Progressing

## 2023-10-22 NOTE — Progress Notes (Signed)
 Mood/ affect: Pleasant and cooperative.  Sad affect. Restless.   Psych assessment:  Report she feels numb.  Denies SI/HI and AVH.  Behavior:  Present in the milieu.  Minimal interaction with peers and staff.   Medication/ PRNs: Compliant with scheduled medications.   Pain: Denies  15 min checks in place for safety.

## 2023-10-22 NOTE — Progress Notes (Signed)
 [facility] MD Progress Note  10/22/2023 2:23 PM Phillip [name]  MRN:  [id number]  Patient is 58 year old male admitted to the inpatient unit voluntarily. On admission, patient reported increased anxiety, depression and paranoia that has increased in intensity over the past two weeks. He also reported increased stressors including ongoing struggle with pornography addiction, recent death of father in law and his own father's physical health declining. He reported his addiction with pornography has been the worst it's ever been. He reported it initially started with clicking links on Facebook, but has progressed to him searching on the internet for the material.  Subjective:  Chart reviewed, case discussed in multidisciplinary meeting, patient seen during rounds.  On interview patient is noted to be sitting in the dayroom.  He remains discharge focused and reports that he is feeling anxious and he wants to go home.  He is really not able to discuss any safe discharge planning except for repeating himself that he is anxious and he needs to go home.  Provider tried to discuss and educate him about his anxiety being the main trigger for the admission and the need to be monitored on Prozac  for at least few days.  Provider also educated on increasing the Prozac  to 20 mg.  Patient is not able to engage in any meaningful discussion and remains focused on leaving.  Discussed in treatment team and advised social work team to reach out to patient's wife for safe discharge planning.  He denies SI/HI/plan and denies hallucinations.   Sleep: Fair  Appetite:  Fair  Past Psychiatric History: see h&P Family History:  Family History  Problem Relation Age of Onset   Diabetes Mother    Diabetes Father    Heart failure Father    Schizophrenia Paternal Grandmother    Social History:  Social History   Substance and Sexual Activity  Alcohol Use Never     Social History   Substance and Sexual Activity  Drug Use  Never    Social History   Socioeconomic History   Marital status: Married    Spouse name: Not on file   Number of children: Not on file   Years of education: Not on file   Highest education level: Not on file  Occupational History   Not on file  Tobacco Use   Smoking status: Never   Smokeless tobacco: Never  Vaping Use   Vaping status: Never Used  Substance and Sexual Activity   Alcohol use: Never   Drug use: Never   Sexual activity: Not on file  Other Topics Concern   Not on file  Social History Narrative   Not on file   Social Drivers of Health   Financial Resource Strain: Low Risk  (03/16/2021)   Received from [facility] prior to [facility]18/2022[facility]   Overall Financial Resource Strain (CARDIA)    Difficulty of Paying Living Expenses: Not very hard  Food Insecurity: No Food Insecurity (10/19/2023)   Hunger Vital Sign    Worried About Running Out of Food in the Last Year: Never true    Ran Out of Food in the Last Year: Never true  Transportation Needs: No Transportation Needs (10/19/2023)   PRAPARE - Administrator, Civil Service (Medical): No    Lack of Transportation (Non-Medical): No  Physical Activity: Inactive (03/16/2021)   Received from [facility] prior to 04/18/2022., [facility]   Exercise Vital Sign  On average, how many days per week do you engage in moderate to strenuous exercise (like a brisk walk)?: 0 days    On average, how many minutes do you engage in exercise at this level?: 0 min  Stress: Stress Concern Present (03/16/2021)   Received from [facility] prior to [facility]18/2022[facility]   [employer] of Occupational Health - Occupational Stress Questionnaire    Feeling of Stress : To some extent  Social Connections: Moderately Integrated (10/19/2023)   Social Connection and Isolation Panel    Frequency of Communication with Friends and Family:  Once a week    Frequency of Social Gatherings with Friends and Family: Twice a week    Attends Religious Services: 1 to 4 times per year    Active Member of [employer] or Organizations: No    Attends Engineer, structural: Never    Marital Status: Married   Past Medical History:  Past Medical History:  Diagnosis Date   Anxiety    Borderline diabetic    Hypertension    Pre-diabetes     Past Surgical History:  Procedure Laterality Date   HERNIA REPAIR      Current Medications: Current Facility-Administered Medications  Medication Dose Route Frequency Provider Last Rate Last Admin   acetaminophen  (TYLENOL ) tablet 650 mg  650 mg Oral Q6H PRN Phillip [name], Phillip L, NP       alum & mag hydroxide-simeth (MAALOX/MYLANTA) 200-200-20 MG/5ML suspension 30 mL  30 mL Oral Q4H PRN Phillip [name], Phillip L, NP       [START ON 10/23/2023] FLUoxetine  (PROZAC ) capsule 20 mg  20 mg Oral Daily Phillip Hollister, MD       hydrOXYzine  (ATARAX ) tablet 25 mg  25 mg Oral TID PRN Phillip [name], Phillip L, NP       irbesartan  (AVAPRO ) tablet 150 mg  150 mg Oral Daily Phillip [name], Phillip L, NP   150 mg at 10/22/23 9090   magnesium  hydroxide (MILK OF MAGNESIA) suspension 30 mL  30 mL Oral Daily PRN Phillip [name], Phillip L, NP       metFORMIN  (GLUCOPHAGE -XR) 24 hr tablet 1,000 mg  1,000 mg Oral BID WC Phillip [name], Phillip L, NP   1,000 mg at 10/22/23 0908   multivitamin with minerals tablet 1 tablet  1 tablet Oral Daily Phillip Dayrit, MD   1 tablet at 10/22/23 9090   OLANZapine  (ZYPREXA ) injection 5 mg  5 mg Intramuscular TID PRN Phillip [name], Phillip L, NP       OLANZapine  zydis (ZYPREXA ) disintegrating tablet 5 mg  5 mg Oral TID PRN Phillip [name], Phillip L, NP       QUEtiapine  (SEROQUEL ) tablet 50 mg  50 mg Oral QHS Phillip Seal, MD   50 mg at 10/21/23 2146   traZODone  (DESYREL ) tablet 50 mg  50 mg Oral QHS PRN Phillip [name], Phillip L, NP   50 mg at 10/20/23 2221    Lab Results:  No results found for this or any previous visit (from the past 48  hours).   Blood Alcohol level:  Lab Results  Component Value Date   [facility] <15 10/19/2023    Metabolic Disorder Labs: Lab Results  Component Value Date   HGBA1C 6.0 (H) 10/19/2023   MPG 125.5 10/19/2023   No results found for: PROLACTIN Lab Results  Component Value Date   CHOL 172 10/19/2023   TRIG 127 10/19/2023   HDL 52 10/19/2023   CHOLHDL 3.3 10/19/2023   VLDL 25 10/19/2023   LDLCALC  95 10/19/2023    Physical Findings: AIMS:  , ,  ,  ,    CIWA:    COWS:      Psychiatric Specialty Exam:  Presentation  General Appearance:  Appropriate for Environment  Eye Contact: Fair  Speech: Clear and Coherent  Speech Volume: Decreased    Mood and Affect  Mood: Anxious; Depressed; Hopeless  Affect: Appropriate; Depressed   Thought Process  Thought Processes: Coherent; Linear; Goal Directed  Descriptions of Associations:Intact  Orientation:Full (Time, Place and Person)  Thought Content:Paranoid Ideation  Hallucinations: Denies  Ideas of Reference:Paranoia  Suicidal Thoughts: Denies  Homicidal Thoughts: Denies   Sensorium  Memory: Recent Fair; Remote Fair; Immediate Fair  Judgment: Impaired  Insight: Fair   Chartered certified accountant: Fair  Attention Span: Fair  Recall: [employer] of Knowledge: Fair  Language: Good   Psychomotor Activity  Psychomotor Activity: No data recorded  Musculoskeletal: Strength & Muscle Tone: within normal limits Gait & Station: normal Assets  Assets: Desire for Improvement; Housing; Social Support; Vocational/Educational; Manufacturing systems engineer    Physical Exam: Physical Exam Vitals and nursing note reviewed.    ROS Blood pressure 110/86, pulse 100, temperature 98.4 F (36.9 C), resp. rate 16, height 5' 8.5 (1.74 m), weight 95.9 kg, SpO2 99%. Body mass index is 31.69 kg/m.  Diagnosis: Principal Problem:   MDD (major depressive disorder), recurrent, severe, with psychosis  (HCC)   PLAN: Safety and Monitoring:  -- Voluntary admission to inpatient psychiatric unit for safety, stabilization and treatment  -- Daily contact with patient to assess and evaluate symptoms and progress in treatment  -- Patient's case to be discussed in multi-disciplinary team meeting  -- Observation Level : q15 minute checks  -- Vital signs:  q12 hours  -- Precautions: suicide, elopement, and assault -- Encouraged patient to participate in unit milieu and in scheduled group therapies  2. Psychiatric Diagnoses and Treatment:  Prozac  increased to 20 mg daily and Seroquel  50 mg nightly       3. Medical Issues Being Addressed:     4. Discharge Planning:   -- Social work and case management to assist with discharge planning and identification of hospital follow-up needs prior to discharge  -- Estimated LOS: 3-4 days  Phillip Rubinstein, MD 10/22/2023, 2:23 PM

## 2023-10-22 NOTE — Group Note (Signed)
 Date:  10/22/2023 Time:  12:03 PM  Group Topic/Focus:  Fresh air therapy with music and cards    Participation Level:  Active  Participation Quality:  Appropriate  Affect:  Appropriate  Cognitive:  Appropriate  Insight: Appropriate  Engagement in Group:  Engaged  Modes of Intervention:  Activity and Socialization  Additional Comments:  none  Norleen SHAUNNA Bias 10/22/2023, 12:03 PM

## 2023-10-22 NOTE — Group Note (Signed)
 Date:  10/22/2023 Time:  8:47 PM  Group Topic/Focus:  Goals Group:   The focus of this group is to help patients establish daily goals to achieve during treatment and discuss how the patient can incorporate goal setting into their daily lives to aide in recovery.    Participation Level:  Active  Participation Quality:  Appropriate  Affect:  Appropriate  Cognitive:  Appropriate  Insight: Appropriate and Good  Engagement in Group:  Engaged  Modes of Intervention:  Discussion  Additional Comments:    Phillip [name] 10/22/2023, 8:47 PM

## 2023-10-22 NOTE — Group Note (Signed)
 Recreation Therapy Group Note   Group Topic:Stress Management  Group Date: 10/22/2023 Start Time: 1500 End Time: 1600 Facilitators: Celestia Jeoffrey BRAVO, LRT, CTRS Location: Courtyard  Group Description: Stress Jenga. LRT and pts played games of Jenga. LRT prompted group discussion on the physical signs and symptoms of stress, similar to the feeling when you're playing Jenga and trying to remove a wooden block from the stack without it all collapsing. LRT and pt discussed the physical and mental signs of stress, as well as coping skills to manage them.   Goal Area(s) Addressed: Patient will identify physical symptoms of stress. Patient will identify coping skills for stress. Patient will build frustration tolerance skills.  Patient will increase communication.    Affect/Mood: Appropriate and Flat   Participation Level: Active and Engaged   Participation Quality: Independent   Behavior: Appropriate, Calm, and Cooperative   Speech/Thought Process: Coherent   Insight: Good   Judgement: Good   Modes of Intervention: Cooperative Play and Music   Patient Response to Interventions:  Attentive, Engaged, and Receptive   Education Outcome:  Acknowledges education   Clinical Observations/Individualized Feedback: Phillip [name] was active in their participation of session activities and group discussion. Pt interacted well with LRT and patients duration of session. Pt shared that he is looking forward to going home.    Plan: Continue to engage patient in RT group sessions 2-3x/week.   Jeoffrey BRAVO Celestia, LRT, CTRS 10/22/2023 4:32 PM

## 2023-10-22 NOTE — Group Note (Signed)
 Physical/Occupational Therapy Group Note  Group Topic: UE Therex   Group Date: 10/22/2023 Start Time: 1310 End Time: 1350 Facilitators: Clive Warren CROME, OT   Group Description: Group instructed in series of upper extremities exercises, aimed to promote strength, flexibility, range of motion and functional endurance.  Patients provided cuing for proper mechanics and proper pace of exercise; exercises adjusted as necessary for individualized patient needs.  Patient also engaged in cognitive components throughout session, working to integrate attention to task, command following, turn-taking and appropriate social interaction throughout session.  Allowed to ask questions as appropriate, and encouraged to identify specific exercises that they could complete independently outside of group sessions.  Therapeutic Goal(s):  Demonstrate appropriate performance of upper extremity exercises to promote strength, flexibility, range of motion and functional endurance Identify 2-3 specific upper extremity exercises to complete as home exercise program outside of group session  Individual Participation: Pt attended the full session and was participatory throughout. MIN VC for fine tuning technique. Engaged with prompting. Asked thoughtful questions.    Participation Level: Active and Engaged   Participation Quality: Minimal Cues   Behavior: Alert, Appropriate, Attentive , and Interactive    Speech/Thought Process: Directed and Relevant   Affect/Mood: Appropriate and Stable    Insight: Moderate   Judgement: Moderate   Modes of Intervention: Activity, Clarification, Discussion, Education, Exploration, Problem-solving, Rapport Building, Socialization, and Support  Patient Response to Interventions:  Attentive, Engaged, Interested , and Receptive   Plan: Continue to engage patient in PT/OT groups 1 - 2x/week.  Norrine Ballester R., MPH, MS, OTR/L ascom [phone] 10/22/23, 3:01 PM

## 2023-10-22 NOTE — BHH Counselor (Signed)
 Adult Comprehensive Assessment  Patient ID: Phillip [name], male   DOB: November 03, 1965, 58 y.o.   MRN: [id number]  Information Source: Information source: Patient  Current Stressors:  Patient states their primary concerns and needs for treatment are:: anxiety, depression, and some paranoia Patient states their goals for this hospitilization and ongoing recovery are:: to get out of here as soon as possible to find some balance Educational / Learning stressors: Pt denies. Employment / Job issues: probably a 7 or 8 on a scale of 10, going through an upgrade Family Relationships: my wife adn I are having some issues brought on by me Financial / Lack of resources (include bankruptcy): getting back to wrk will be good Housing / Lack of housing: Pt denies. Physical health (include injuries & life threatening diseases): Type 2 but it's controlled, high blood pressure but it's medicated Social relationships: Pt denies. Substance abuse: Pt denies. Bereavement / Loss: my father in law passed a couple of month's back  Living/Environment/Situation:  Living Arrangements: Spouse/significant other, Children Living conditions (as described by patient or guardian): WNL Who else lives in the home?: wife, 2 daughters, 1 grandchild How long has patient lived in current situation?: 2008 What is atmosphere in current home: Other (Comment) (stressful due to me)  Family History:  Marital status: Married Number of Years Married: 30 What types of issues is patient dealing with in the relationship?: my porn addiction Does patient have children?: Yes How many children?: 3 How is patient's relationship with their children?: Ok, they are more understanding  Childhood History:  By whom was/is the patient raised?: Both parents Description of patient's relationship with caregiver when they were a child: no issues, father was a Optician, dispensing and ex-Marine Patient's description of current  relationship with people who raised him/her: good, no issues, they are supportive How were you disciplined when you got in trouble as a child/adolescent?: corporal Does patient have siblings?: Yes Number of Siblings: 1 Description of patient's current relationship with siblings: Pt reports that he has one sister we don't talk a great deal but it's ok Did patient suffer any verbal/emotional/physical/sexual abuse as a child?: No Did patient suffer from severe childhood neglect?: No Has patient ever been sexually abused/assaulted/raped as an adolescent or adult?: No Was the patient ever a victim of a crime or a disaster?: No Witnessed domestic violence?: No Has patient been affected by domestic violence as an adult?: No  Education:  Highest grade of school patient has completed: Chief Operating Officer in Investment banker, corporate Currently a Consulting civil engineer?: No Learning disability?: No  Employment/Work Situation:   Employment Situation: Employed Where is Patient Currently Employed?: [employer] How Long has Patient Been Employed?: 29 years Are You Satisfied With Your Job?: Yes Do You Work More Than One Job?: No Work Stressors: I just feel stressed out Patient's Job has Been Impacted by Current Illness: No What is the Longest Time Patient has Held a Job?: current employment Has Patient ever Been in the [employer]?: No  Financial Resources:   Financial resources: Income from employment, Private insurance Does patient have a representative payee or guardian?: No  Alcohol/Substance Abuse:   What has been your use of drugs/alcohol within the last 12 months?: Pt denies. If attempted suicide, did drugs/alcohol play a role in this?: No Alcohol/Substance Abuse Treatment Hx: Denies past history Has alcohol/substance abuse ever caused legal problems?: No  Social Support System:   Patient's Community Support System: Good Describe Community Support System: I feel like it's weakening just a little bit.  It's my daughters, wife, mom and dad Type of faith/religion: Chrisitan, Baptist How does patient's faith help to cope with current illness?: attend church  Leisure/Recreation:   Do You Have Hobbies?: Yes Leisure and Hobbies: race team  Strengths/Needs:   What is the patient's perception of their strengths?: loyal and respectful Patient states they can use these personal strengths during their treatment to contribute to their recovery: Pt denies. Patient states these barriers may affect/interfere with their treatment: my mind Patient states these barriers may affect their return to the community: my mind Other important information patient would like considered in planning for their treatment: Pt denies.  Discharge Plan:   Currently receiving community mental health services: No Patient states concerns and preferences for aftercare planning are: Pt reports that he is open to a referral at discharge. Patient states they will know when they are safe and ready for discharge when: to be determined.  I don't know Does patient have access to transportation?: Yes Does patient have financial barriers related to discharge medications?: No Will patient be returning to same living situation after discharge?: Yes  Summary/Recommendations:   Summary and Recommendations (to be completed by the evaluator): Patient is a 58 year old [place] [facility]  He presents to the hospital for concerns of increased anxiety, depression and paranoia.  He reports that his admission was triggered by his "porn addiction".  He reports that he has viewed pornography while at work and now has a fear that he will lose his employment.  He reports that he has worked at the same company for 30 years.  He reports a belief that his watching pornography has negatively impacted his relationship with his wife and daughters.  He reports paranoia that "people are plotting against him and watching  him, and he is especially concerned that co-workers are watching him".  He reports that he does not have a current mental health provider, however, is open to a referral to see someone.  Recommendations include crisis stabilization, therapeutic milieu, encourage group attendance and participation, medication management for detox/mood stabilization and development of comprehensive mental wellness/sobriety plan.  Sherryle JINNY Margo. 10/22/2023

## 2023-10-22 NOTE — Plan of Care (Signed)
  Problem: Education: Goal: Verbalization of understanding the information provided will improve Outcome: Progressing   Problem: Activity: Goal: Interest or engagement in activities will improve Outcome: Progressing   Problem: Coping: Goal: Ability to demonstrate self-control will improve Outcome: Progressing   

## 2023-10-23 DIAGNOSIS — F410 Panic disorder [episodic paroxysmal anxiety] without agoraphobia: Secondary | ICD-10-CM

## 2023-10-23 DIAGNOSIS — F411 Generalized anxiety disorder: Secondary | ICD-10-CM

## 2023-10-23 DIAGNOSIS — F333 Major depressive disorder, recurrent, severe with psychotic symptoms: Secondary | ICD-10-CM | POA: Diagnosis not present

## 2023-10-23 NOTE — Progress Notes (Signed)
 [facility] MD Progress Note  10/23/2023 1:57 PM Phillip [name]  MRN:  [id number]  Patient is 58 year old male admitted to the inpatient unit voluntarily. On admission, patient reported increased anxiety, depression and paranoia that has increased in intensity over the past two weeks. He also reported increased stressors including ongoing struggle with pornography addiction, recent death of father in law and his own father's physical health declining. He reported his addiction with pornography has been the worst it's ever been. He reported it initially started with clicking links on Facebook, but has progressed to him searching on the internet for the material.  Subjective: Per staff report patient doing well.  No concerns reported.  Patient presented after there is a death in the family and had some pornographic addiction.  Patient on interview reports that he was feeling overwhelmed as he was worried about things at that job.  He also reports that recently his middle daughter also has a grandkid also adding to his anxiety.  He also reports that he had pornographic addiction and recently went and told his wife 2 weeks ago and seems that his marriage is falling apart.  Patient also reports that he is not sure if he has a job as he has used Facebook on his mobile looking for pornography while he was at work.  Patient reports that he is not sure whether his job people are even aware about his pornography addiction.patient also reports that he does not want to ensure we will discontinue that.  Patient reports that he had a similar episode in the past several years ago.  Reports that he is doing better at this time.  Patient does appear anxious but reports that he does not want to take medication and want to keep it at the minimum.  He feels that his anxiety is now better controlled.  Discussed with patient about increasing Prozac  but he does not want to increase Prozac  at this time.  Reports that he is also on  Seroquel  and feels like he is sleeping okay.  Denies any suicidal homicidal ideations or any auditory visual hallucinations.  Patient reports that he is willing to go to marriage counselors and therapy on outpatient basis.    Sleep: Fair  Appetite:  Fair  Past Psychiatric History: see h&P Family History:  Family History  Problem Relation Age of Onset   Diabetes Mother    Diabetes Father    Heart failure Father    Schizophrenia Paternal Grandmother    Social History:  Social History   Substance and Sexual Activity  Alcohol Use Never     Social History   Substance and Sexual Activity  Drug Use Never    Social History   Socioeconomic History   Marital status: Married    Spouse name: Not on file   Number of children: Not on file   Years of education: Not on file   Highest education level: Not on file  Occupational History   Not on file  Tobacco Use   Smoking status: Never   Smokeless tobacco: Never  Vaping Use   Vaping status: Never Used  Substance and Sexual Activity   Alcohol use: Never   Drug use: Never   Sexual activity: Not on file  Other Topics Concern   Not on file  Social History Narrative   Not on file   Social Drivers of Health   Financial Resource Strain: Low Risk  (03/16/2021)   Received from [facility] prior to 04/18/2022., [facility]   Overall Financial Resource Strain (CARDIA)    Difficulty of Paying Living Expenses: Not very hard  Food Insecurity: No Food Insecurity (10/19/2023)   Hunger Vital Sign    Worried About Running Out of Food in the Last Year: Never true    Ran Out of Food in the Last Year: Never true  Transportation Needs: No Transportation Needs (10/19/2023)   PRAPARE - Administrator, Civil Service (Medical): No    Lack of Transportation (Non-Medical): No  Physical Activity: Inactive (03/16/2021)   Received from [facility] visits prior to 04/18/2022., [facility]    Exercise Vital Sign    On average, how many days per week do you engage in moderate to strenuous exercise (like a brisk walk)?: 0 days    On average, how many minutes do you engage in exercise at this level?: 0 min  Stress: Stress Concern Present (03/16/2021)   Received from [facility] visits prior to 04/18/2022., [facility]   [employer] of Occupational Health - Occupational Stress Questionnaire    Feeling of Stress : To some extent  Social Connections: Moderately Integrated (10/19/2023)   Social Connection and Isolation Panel    Frequency of Communication with Friends and Family: Once a week    Frequency of Social Gatherings with Friends and Family: Twice a week    Attends Religious Services: 1 to 4 times per year    Active Member of [employer] or Organizations: No    Attends Engineer, structural: Never    Marital Status: Married   Past Medical History:  Past Medical History:  Diagnosis Date   Anxiety    Borderline diabetic    Hypertension    Pre-diabetes     Past Surgical History:  Procedure Laterality Date   HERNIA REPAIR      Current Medications: Current Facility-Administered Medications  Medication Dose Route Frequency Provider Last Rate Last Admin   acetaminophen  (TYLENOL ) tablet 650 mg  650 mg Oral Q6H PRN White, Patrice L, NP       alum & mag hydroxide-simeth (MAALOX/MYLANTA) 200-200-20 MG/5ML suspension 30 mL  30 mL Oral Q4H PRN White, Patrice L, NP       feeding supplement (ENSURE PLUS HIGH PROTEIN) liquid 237 mL  237 mL Oral BID BM Bobbitt, Shalon E, NP       FLUoxetine  (PROZAC ) capsule 20 mg  20 mg Oral Daily Jadapalle, Sree, MD   20 mg at 10/23/23 9095   hydrOXYzine  (ATARAX ) tablet 25 mg  25 mg Oral TID PRN Teresa Jes L, NP       irbesartan  (AVAPRO ) tablet 150 mg  150 mg Oral Daily White, Patrice L, NP   150 mg at 10/23/23 9096   magnesium  hydroxide (MILK OF MAGNESIA) suspension 30 mL  30 mL Oral Daily PRN White, Patrice L, NP        metFORMIN  (GLUCOPHAGE -XR) 24 hr tablet 1,000 mg  1,000 mg Oral BID WC White, Patrice L, NP   1,000 mg at 10/23/23 0859   multivitamin with minerals tablet 1 tablet  1 tablet Oral Daily Jadapalle, Sree, MD   1 tablet at 10/23/23 9095   OLANZapine  (ZYPREXA ) injection 5 mg  5 mg Intramuscular TID PRN White, Patrice L, NP       OLANZapine  zydis (ZYPREXA ) disintegrating tablet 5 mg  5 mg Oral TID PRN Teresa Jes CROME, NP  QUEtiapine  (SEROQUEL ) tablet 50 mg  50 mg Oral QHS Jadapalle, Sree, MD   50 mg at 10/22/23 2115   traZODone  (DESYREL ) tablet 50 mg  50 mg Oral QHS PRN White, Patrice L, NP   50 mg at 10/20/23 2221    Lab Results:  No results found for this or any previous visit (from the past 48 hours).   Blood Alcohol level:  Lab Results  Component Value Date   [facility] <15 10/19/2023    Metabolic Disorder Labs: Lab Results  Component Value Date   HGBA1C 6.0 (H) 10/19/2023   MPG 125.5 10/19/2023   No results found for: PROLACTIN Lab Results  Component Value Date   CHOL 172 10/19/2023   TRIG 127 10/19/2023   HDL 52 10/19/2023   CHOLHDL 3.3 10/19/2023   VLDL 25 10/19/2023   LDLCALC 95 10/19/2023    Physical Findings: AIMS:  , ,  ,  ,    CIWA:    COWS:      Psychiatric Specialty Exam:  Presentation  General Appearance:  Appropriate for Environment  Eye Contact: Fair  Speech: Clear and Coherent  Speech Volume: Decreased    Mood and Affect  Mood: Anxious; Depressed; Hopeless  Affect: Appropriate; Depressed   Thought Process  Thought Processes: Coherent; Linear; Goal Directed  Descriptions of Associations:Intact  Orientation:Full (Time, Place and Person)  Thought Content:Paranoid Ideation  Hallucinations: Denies  Ideas of Reference:Paranoia  Suicidal Thoughts: Denies  Homicidal Thoughts: Denies   Sensorium  Memory: Recent Fair; Remote Fair; Immediate Fair  Judgment: Impaired  Insight: Fair   Producer, television/film/video: Fair  Attention Span: Fair  Recall: [employer] of Knowledge: Fair  Language: Good   Psychomotor Activity  Psychomotor Activity: No data recorded  Musculoskeletal: Strength & Muscle Tone: within normal limits Gait & Station: normal Assets  Assets: Desire for Improvement; Housing; Social Support; Vocational/Educational; Manufacturing systems engineer    Physical Exam: Physical Exam Vitals and nursing note reviewed.    ROS Blood pressure 131/80, pulse 76, temperature (!) 97 F (36.1 C), resp. rate 19, height 5' 8.5 (1.74 m), weight 95.9 kg, SpO2 98%. Body mass index is 31.69 kg/m.  Diagnosis: Principal Problem:   MDD (major depressive disorder), recurrent, severe, with psychosis (HCC) Generalized anxiety disorder with panic attack R/o OCD  PLAN: Safety and Monitoring:  -- Voluntary admission to inpatient psychiatric unit for safety, stabilization and treatment  -- Daily contact with patient to assess and evaluate symptoms and progress in treatment  -- Patient's case to be discussed in multi-disciplinary team meeting  -- Observation Level : q15 minute checks  -- Vital signs:  q12 hours  -- Precautions: suicide, elopement, and assault -- Encouraged patient to participate in unit milieu and in scheduled group therapies  2. Psychiatric Diagnoses and Treatment:  Continue Prozac  20 mg daily and Seroquel  50 mg at nightly   3. Medical Issues Being Addressed:   hypertension Irbesartan  150 mg daily Diabetes-metformin  1000 twice daily  4. Discharge Planning:   -- Social work and case management to assist with discharge planning and identification of hospital follow-up needs prior to discharge  -- Estimated LOS: 3-4 days  Desmond Chimera, MD 10/23/2023, 1:57 PM

## 2023-10-23 NOTE — Group Note (Signed)
 Date:  10/23/2023 Time:  10:34 AM  Group Topic/Focus:  Goals Group:   The focus of this group is to help patients establish daily goals to achieve during treatment and discuss how the patient can incorporate goal setting into their daily lives to aide in recovery. We also talked about healthy eating and healthy lifestyle choices.     Participation Level:  Active  Participation Quality:  Appropriate and Attentive  Affect:  Appropriate  Cognitive:  Appropriate  Insight: Appropriate and Good  Engagement in Group:  Engaged  Modes of Intervention:  Activity and Discussion  Additional Comments:    Franklyn Cafaro L Mekai Wilkinson 10/23/2023, 10:34 AM

## 2023-10-23 NOTE — Group Note (Signed)
 Date:  10/23/2023 Time:  8:26 PM  Group Topic/Focus:  Overcoming Stress:   The focus of this group is to define stress and help patients assess their triggers.    Participation Level:  Active  Participation Quality:  Appropriate  Affect:  Appropriate  Cognitive:  Appropriate  Insight: Good  Engagement in Group:  Engaged  Modes of Intervention:  Discussion  Additional Comments:    Romero Earnie Hope 10/23/2023, 8:26 PM

## 2023-10-23 NOTE — Progress Notes (Signed)
 Patient visible in Milieu interacting well with Peers and Staff denies SI/HI/A/VH and verbally contracts for safety. Compliant with medications no adverse effects noted. Patient in bed sleeping respirations noted. Q 15 minutes safety checks ongoing.

## 2023-10-24 DIAGNOSIS — F333 Major depressive disorder, recurrent, severe with psychotic symptoms: Secondary | ICD-10-CM | POA: Diagnosis not present

## 2023-10-24 DIAGNOSIS — F411 Generalized anxiety disorder: Secondary | ICD-10-CM | POA: Diagnosis not present

## 2023-10-24 DIAGNOSIS — F410 Panic disorder [episodic paroxysmal anxiety] without agoraphobia: Secondary | ICD-10-CM | POA: Diagnosis not present

## 2023-10-24 NOTE — Plan of Care (Signed)
  Problem: Education: Goal: Emotional status will improve Outcome: Progressing   Problem: Activity: Goal: Sleeping patterns will improve Outcome: Progressing   Problem: Coping: Goal: Ability to demonstrate self-control will improve Outcome: Progressing

## 2023-10-24 NOTE — Plan of Care (Signed)
 Problem: Education: Goal: Knowledge of [facility] Education information/materials will improve Outcome: Progressing Goal: Emotional status will improve Outcome: Progressing Goal: Mental status will improve Outcome: Progressing Goal: Verbalization of understanding the information provided will improve Outcome: Progressing   Problem: Activity: Goal: Interest or engagement in activities will improve Outcome: Progressing Goal: Sleeping patterns will improve Outcome: Progressing   Problem: Coping: Goal: Ability to verbalize frustrations and anger appropriately will improve Outcome: Progressing Goal: Ability to demonstrate self-control will improve Outcome: Progressing   Problem: Health Behavior/Discharge Planning: Goal: Identification of resources available to assist in meeting health care needs will improve Outcome: Progressing Goal: Compliance with treatment plan for underlying cause of condition will improve Outcome: Progressing   Problem: Physical Regulation: Goal: Ability to maintain clinical measurements within normal limits will improve Outcome: Progressing   Problem: Safety: Goal: Periods of time without injury will increase Outcome: Progressing   Problem: Education: Goal: Ability to state activities that reduce stress will improve Outcome: Progressing   Problem: Coping: Goal: Ability to identify and develop effective coping behavior will improve Outcome: Progressing   Problem: Self-Concept: Goal: Ability to identify factors that promote anxiety will improve Outcome: Progressing Goal: Level of anxiety will decrease Outcome: Progressing Goal: Ability to modify response to factors that promote anxiety will improve Outcome: Progressing   Problem: Education: Goal: Utilization of techniques to improve thought processes will improve Outcome: Progressing Goal: Knowledge of the prescribed therapeutic regimen will improve Outcome: Progressing   Problem:  Activity: Goal: Interest or engagement in leisure activities will improve Outcome: Progressing Goal: Imbalance in normal sleep/wake cycle will improve Outcome: Progressing   Problem: Coping: Goal: Coping ability will improve Outcome: Progressing Goal: Will verbalize feelings Outcome: Progressing   Problem: Health Behavior/Discharge Planning: Goal: Ability to make decisions will improve Outcome: Progressing Goal: Compliance with therapeutic regimen will improve Outcome: Progressing   Problem: Role Relationship: Goal: Will demonstrate positive changes in social behaviors and relationships Outcome: Progressing   Problem: Safety: Goal: Ability to disclose and discuss suicidal ideas will improve Outcome: Progressing Goal: Ability to identify and utilize support systems that promote safety will improve Outcome: Progressing   Problem: Self-Concept: Goal: Will verbalize positive feelings about self Outcome: Progressing Goal: Level of anxiety will decrease Outcome: Progressing   Problem: Education: Goal: Ability to make informed decisions regarding treatment will improve Outcome: Progressing   Problem: Coping: Goal: Coping ability will improve Outcome: Progressing   Problem: Health Behavior/Discharge Planning: Goal: Identification of resources available to assist in meeting health care needs will improve Outcome: Progressing   Problem: Medication: Goal: Compliance with prescribed medication regimen will improve Outcome: Progressing   Problem: Self-Concept: Goal: Ability to disclose and discuss suicidal ideas will improve Outcome: Progressing Goal: Will verbalize positive feelings about self Outcome: Progressing Note:     Problem: Education: Goal: Knowledge of General Education information will improve Description: Including pain rating scale, medication(s)/side effects and non-pharmacologic comfort measures Outcome: Progressing   Problem: Health  Behavior/Discharge Planning: Goal: Ability to manage health-related needs will improve Outcome: Progressing   Problem: Clinical Measurements: Goal: Ability to maintain clinical measurements within normal limits will improve Outcome: Progressing Goal: Will remain free from infection Outcome: Progressing Goal: Diagnostic test results will improve Outcome: Progressing Goal: Respiratory complications will improve Outcome: Progressing Goal: Cardiovascular complication will be avoided Outcome: Progressing   Problem: Activity: Goal: Risk for activity intolerance will decrease Outcome: Progressing   Problem: Nutrition: Goal: Adequate nutrition will be maintained Outcome: Progressing   Problem: Coping: Goal: Level  of anxiety will decrease Outcome: Progressing   Problem: Elimination: Goal: Will not experience complications related to bowel motility Outcome: Progressing Goal: Will not experience complications related to urinary retention Outcome: Progressing

## 2023-10-24 NOTE — Group Note (Signed)
 LCSW Group Therapy Note  Group Date: 10/24/2023 Start Time: 1310 End Time: 1400   Type of Therapy and Topic:  Group Therapy - Healthy vs Unhealthy Coping Skills  Participation Level:  Active   Description of Group The focus of this group was to determine what unhealthy coping techniques typically are used by group members and what healthy coping techniques would be helpful in coping with various problems. Patients were guided in becoming aware of the differences between healthy and unhealthy coping techniques. Patients were asked to identify 2-3 healthy coping skills they would like to learn to use more effectively.  Therapeutic Goals Patients learned that coping is what human beings do all day long to deal with various situations in their lives Patients defined and discussed healthy vs unhealthy coping techniques Patients identified their preferred coping techniques and identified whether these were healthy or unhealthy Patients determined 2-3 healthy coping skills they would like to become more familiar with and use more often. Patients provided support and ideas to each other   Summary of Patient Progress:  Patient was attentive and active in group. Patient demonstrated positive insight into the subject matter, was respectful of peers, and participated throughout the entire session.   Therapeutic Modalities Cognitive Behavioral Therapy Motivational Interviewing  Phillip [name] [name], MSW, LCSW 10/24/2023  2:54 PM

## 2023-10-24 NOTE — Progress Notes (Signed)
 [facility] MD Progress Note  10/24/2023 3:53 PM Phillip [name]  MRN:  [id number]  Patient is 58 year old male admitted to the inpatient unit voluntarily. On admission, patient reported increased anxiety, depression and paranoia that has increased in intensity over the past two weeks. He also reported increased stressors including ongoing struggle with pornography addiction, recent death of father in law and his own father's physical health declining. He reported his addiction with pornography has been the worst it's ever been. He reported it initially started with clicking links on Facebook, but has progressed to him searching on the internet for the material.  Subjective: Per staff report patient is doing better.  Have been coming out.  Still look anxious but engages well with peers.  Patient interviewed reports that he is doing good.  Denies any suicide homicidal ideations or any audiovisual hallucinations.  Reports that he his daughters are his main support system.  Patient reports that him and his wife are going through separation or tough time.  Reports that he he is open for couples counseling but he is not sure whether his wife is open for it or not.  Patient reports that he had 1 previous episode in 2005/2008 and at that time his wife choose to continue to work with him.  He does not think that this time his wife is that supportive.  Patient does not want to increase his medication at this time.  Reports that he is not sure about his job but he has FMLA papers signed.  He reports that he hopes that he did not lose his job.   Sleep: Fair  Appetite:  Fair  Past Psychiatric History: see h&P Family History:  Family History  Problem Relation Age of Onset   Diabetes Mother    Diabetes Father    Heart failure Father    Schizophrenia Paternal Grandmother    Social History:  Social History   Substance and Sexual Activity  Alcohol Use Never     Social History   Substance and Sexual Activity   Drug Use Never    Social History   Socioeconomic History   Marital status: Married    Spouse name: Not on file   Number of children: Not on file   Years of education: Not on file   Highest education level: Not on file  Occupational History   Not on file  Tobacco Use   Smoking status: Never   Smokeless tobacco: Never  Vaping Use   Vaping status: Never Used  Substance and Sexual Activity   Alcohol use: Never   Drug use: Never   Sexual activity: Not on file  Other Topics Concern   Not on file  Social History Narrative   Not on file   Social Drivers of Health   Financial Resource Strain: Low Risk  (03/16/2021)   [facility] to [facility]   Overall Financial Resource Strain (CARDIA)    Difficulty of Paying Living Expenses: Not very hard  Food Insecurity: No Food Insecurity (10/19/2023)   Hunger Vital Sign    Worried About Running Out of Food in the Last Year: Never true    Ran Out of Food in the Last Year: Never true  Transportation Needs: No Transportation Needs (10/19/2023)   PRAPARE - Administrator, Civil Service (Medical): No    Lack of Transportation (Non-Medical): No  Physical Activity: Inactive (03/16/2021)   [facility] prior to [facility]18/[facility]   Exercise Vital Sign    On average, how many days per week do you engage in moderate to strenuous exercise (like a brisk walk)?: 0 days    On average, how many minutes do you engage in exercise at this level?: 0 min  Stress: Stress Concern Present (03/16/2021)   Received from [facility] visits prior to [facility]18/[facility]   [employer] of Occupational Health - Occupational Stress Questionnaire    Feeling of Stress : To some extent  Social Connections: Moderately Integrated (10/19/2023)   Social Connection and Isolation Panel    Frequency of Communication with Friends and  Family: Once a week    Frequency of Social Gatherings with Friends and Family: Twice a week    Attends Religious Services: 1 to 4 times per year    Active Member of [employer] or Organizations: No    Attends Engineer, structural: Never    Marital Status: Married   Past Medical History:  Past Medical History:  Diagnosis Date   Anxiety    Borderline diabetic    Hypertension    Pre-diabetes     Past Surgical History:  Procedure Laterality Date   HERNIA REPAIR      Current Medications: Current Facility-Administered Medications  Medication Dose Route Frequency Provider Last Rate Last Admin   acetaminophen  (TYLENOL ) tablet 650 mg  650 mg Oral Q6H PRN White, Patrice L, NP       alum & mag hydroxide-simeth (MAALOX/MYLANTA) 200-200-20 MG/5ML suspension 30 mL  30 mL Oral Q4H PRN White, Patrice L, NP       FLUoxetine  (PROZAC ) capsule 20 mg  20 mg Oral Daily Jadapalle, Sree, MD   20 mg at 10/24/23 9089   hydrOXYzine  (ATARAX ) tablet 25 mg  25 mg Oral TID PRN Teresa Jes L, NP       irbesartan  (AVAPRO ) tablet 150 mg  150 mg Oral Daily White, Patrice L, NP   150 mg at 10/24/23 0910   magnesium  hydroxide (MILK OF MAGNESIA) suspension 30 mL  30 mL Oral Daily PRN White, Patrice L, NP       metFORMIN  (GLUCOPHAGE -XR) 24 hr tablet 1,000 mg  1,000 mg Oral BID WC White, Patrice L, NP   1,000 mg at 10/24/23 0856   multivitamin with minerals tablet 1 tablet  1 tablet Oral Daily Jadapalle, Sree, MD   1 tablet at 10/24/23 0910   OLANZapine  (ZYPREXA ) injection 5 mg  5 mg Intramuscular TID PRN White, Patrice L, NP       OLANZapine  zydis (ZYPREXA ) disintegrating tablet 5 mg  5 mg Oral TID PRN White, Patrice L, NP       QUEtiapine  (SEROQUEL ) tablet 50 mg  50 mg Oral QHS Jadapalle, Sree, MD   50 mg at 10/23/23 2112   traZODone  (DESYREL ) tablet 50 mg  50 mg Oral QHS PRN White, Patrice L, NP   50 mg at 10/20/23 2221    Lab Results:  No results found for this or any previous visit (from the past 48  hours).   Blood Alcohol level:  Lab Results  Component Value Date   [facility] <15 10/19/2023    Metabolic Disorder Labs: Lab Results  Component Value Date   HGBA1C 6.0 (H) 10/19/2023   MPG 125.5 10/19/2023   No results found for: PROLACTIN Lab Results  Component Value Date   CHOL 172 10/19/2023   TRIG 127 10/19/2023  HDL 52 10/19/2023   CHOLHDL 3.3 10/19/2023   VLDL 25 10/19/2023   LDLCALC 95 10/19/2023    Physical Findings: AIMS:  , ,  ,  ,    CIWA:    COWS:      Psychiatric Specialty Exam:  Presentation  General Appearance:  Appropriate for Environment  Eye Contact: Fair  Speech: Clear and Coherent  Speech Volume: Decreased    Mood and Affect  Mood: Anxious; Depressed; Hopeless  Affect: Appropriate; Depressed   Thought Process  Thought Processes: Coherent; Linear; Goal Directed  Descriptions of Associations:Intact  Orientation:Full (Time, Place and Person)  Thought Content:Paranoid Ideation  Hallucinations: Denies  Ideas of Reference:Paranoia  Suicidal Thoughts: Denies  Homicidal Thoughts: Denies   Sensorium  Memory: Recent Fair; Remote Fair; Immediate Fair  Judgment: Impaired  Insight: Fair   Chartered certified accountant: Fair  Attention Span: Fair  Recall: [employer] of Knowledge: Fair  Language: Good   Psychomotor Activity  Psychomotor Activity: No data recorded  Musculoskeletal: Strength & Muscle Tone: within normal limits Gait & Station: normal Assets  Assets: Desire for Improvement; Housing; Social Support; Vocational/Educational; Manufacturing systems engineer    Physical Exam: Physical Exam Vitals and nursing note reviewed.    ROS Blood pressure (!) 131/92, pulse 88, temperature 98.3 F (36.8 C), resp. rate 18, height 5' 8.5 (1.74 m), weight 95.9 kg, SpO2 98%. Body mass index is 31.69 kg/m.  Diagnosis: Principal Problem:   MDD (major depressive disorder), recurrent, severe, with  psychosis (HCC) Generalized anxiety disorder with panic attack R/o OCD  PLAN: Safety and Monitoring:  -- Voluntary admission to inpatient psychiatric unit for safety, stabilization and treatment  -- Daily contact with patient to assess and evaluate symptoms and progress in treatment  -- Patient's case to be discussed in multi-disciplinary team meeting  -- Observation Level : q15 minute checks  -- Vital signs:  q12 hours  -- Precautions: suicide, elopement, and assault -- Encouraged patient to participate in unit milieu and in scheduled group therapies  2. Psychiatric Diagnoses and Treatment:  Continue Prozac  20 mg daily and Seroquel  50 mg at nightly   3. Medical Issues Being Addressed:   hypertension Irbesartan  150 mg daily Diabetes-metformin  1000 twice daily  4. Discharge Planning:   -- Social work and case management to assist with discharge planning and identification of hospital follow-up needs prior to discharge  -- Estimated LOS: 3-4 days  Desmond Chimera, MD 10/24/2023, 3:53 PM

## 2023-10-24 NOTE — Progress Notes (Signed)
   10/24/23 2113  Psych Admission Type (Psych Patients Only)  Admission Status Voluntary  Psychosocial Assessment  Patient Complaints None  Eye Contact Brief  Facial Expression Flat;Sad  Affect Sad  Speech Logical/coherent  Interaction Minimal  Motor Activity Slow  Appearance/Hygiene Unremarkable  Behavior Characteristics Appropriate to situation;Cooperative  Mood Pleasant  Thought Process  Coherency WDL  Content WDL  Delusions None reported or observed  Perception WDL  Hallucination None reported or observed  Judgment WDL  Confusion WDL  Danger to Self  Current suicidal ideation? Denies  Agreement Not to Harm Self Yes  Description of Agreement verbal  Danger to Others  Danger to Others None reported or observed

## 2023-10-24 NOTE — Group Note (Signed)
 Date:  10/24/2023 Time:  9:20 PM  Group Topic/Focus:  Wrap-Up Group:   The focus of this group is to help patients review their daily goal of treatment and discuss progress on daily workbooks.    Participation Level:  Active  Participation Quality:  Appropriate  Affect:  Appropriate  Cognitive:  Alert  Insight: Appropriate  Engagement in Group:  Engaged  Modes of Intervention:  Discussion  Additional Comments:    Phillip [name] 10/24/2023, 9:20 PM

## 2023-10-24 NOTE — Progress Notes (Signed)
   10/24/23 0808  Psych Admission Type (Psych Patients Only)  Admission Status Voluntary  Psychosocial Assessment  Patient Complaints Other (Comment) (Just wanting to know when I will discharge)  Eye Contact Brief  Facial Expression Sad  Affect Sad  Speech Logical/coherent  Interaction Minimal  Motor Activity Slow  Appearance/Hygiene Unremarkable  Behavior Characteristics Cooperative  Mood Sad  Aggressive Behavior  Effect No apparent injury  Thought Process  Coherency WDL  Content WDL  Delusions None reported or observed  Perception WDL  Hallucination None reported or observed  Judgment Limited  Confusion None  Danger to Self  Current suicidal ideation? Denies  Agreement Not to Harm Self Yes  Description of Agreement Verbal  Danger to Others  Danger to Others None reported or observed

## 2023-10-24 NOTE — Plan of Care (Signed)
  Problem: Education: Goal: Knowledge of [facility] Education information/materials will improve Outcome: Progressing Goal: Emotional status will improve Outcome: Progressing Goal: Mental status will improve Outcome: Progressing Goal: Verbalization of understanding the information provided will improve Outcome: Progressing   Problem: Activity: Goal: Interest or engagement in activities will improve Outcome: Progressing Goal: Sleeping patterns will improve Outcome: Progressing   Problem: Coping: Goal: Ability to verbalize frustrations and anger appropriately will improve Outcome: Progressing Goal: Ability to demonstrate self-control will improve Outcome: Progressing   Problem: Health Behavior/Discharge Planning: Goal: Identification of resources available to assist in meeting health care needs will improve Outcome: Progressing Goal: Compliance with treatment plan for underlying cause of condition will improve Outcome: Progressing   Problem: Safety: Goal: Periods of time without injury will increase Outcome: Progressing   Problem: Health Behavior/Discharge Planning: Goal: Ability to make decisions will improve Outcome: Progressing Goal: Compliance with therapeutic regimen will improve Outcome: Progressing   Problem: Role Relationship: Goal: Will demonstrate positive changes in social behaviors and relationships Outcome: Progressing

## 2023-10-25 MED ORDER — FLUOXETINE HCL 20 MG PO CAPS: 20.0000 mg | ORAL_CAPSULE | Freq: Every day | ORAL | 0 refills | Status: DC

## 2023-10-25 MED ORDER — ADULT MULTIVITAMIN W/MINERALS CH: 1.0000 | ORAL_TABLET | Freq: Every day | ORAL | 0 refills | Status: AC

## 2023-10-25 MED ORDER — QUETIAPINE FUMARATE 50 MG PO TABS: 50.0000 mg | ORAL_TABLET | Freq: Every day | ORAL | 0 refills | Status: DC

## 2023-10-25 NOTE — Group Note (Signed)
 Date:  10/25/2023 Time:  10:56 AM  Group Topic/Focus:  Goals Group:   The focus of this group is to help patients establish daily goals to achieve during treatment and discuss how the patient can incorporate goal setting into their daily lives to aide in recovery.    Participation Level:  Active  Participation Quality:  Appropriate  Affect:  Appropriate  Cognitive:  Appropriate  Insight: Good  Engagement in Group:  Engaged  Modes of Intervention:  Activity  Additional Comments:  N/A  Harlene LITTIE Gavel 10/25/2023, 10:56 AM

## 2023-10-25 NOTE — Discharge Summary (Signed)
 Physician Discharge Summary Note  Patient:  Phillip [name] is an 58 y.o., male MRN:  [id number] DOB:  August 04, 1965 Patient phone:  [phone] (home)  Patient address:   [address] [place] [address],   Total time spent: 40 min Date of Admission:  10/19/2023 Date of Discharge: 10/25/23  Reason for Admission:  Patient is 58 year old male admitted to the inpatient unit voluntarily. On admission, patient reported increased anxiety, depression and paranoia that has increased in intensity over the past two weeks. He also reported increased stressors including ongoing struggle with pornography addiction, recent death of father in law and his own father's physical health declining. He reported his addiction with pornography has been the worst it's ever been. He reported it initially started with clicking links on Facebook, but has progressed to him searching on the internet for the material.   Principal Problem: MDD (major depressive disorder), recurrent, severe, with psychosis (HCC) Discharge Diagnoses: Principal Problem:   MDD (major depressive disorder), recurrent, severe, with psychosis (HCC)   Past Psychiatric History: see h&p  Family Psychiatric  History: see h&p Social History:  Social History   Substance and Sexual Activity  Alcohol Use Never     Social History   Substance and Sexual Activity  Drug Use Never    Social History   Socioeconomic History   Marital status: Married    Spouse name: Not on file   Number of children: Not on file   Years of education: Not on file   Highest education level: Not on file  Occupational History   Not on file  Tobacco Use   Smoking status: Never   Smokeless tobacco: Never  Vaping Use   Vaping status: Never Used  Substance and Sexual Activity   Alcohol use: Never   Drug use: Never   Sexual activity: Not on file  Other Topics Concern   Not on file  Social History Narrative   Not on file   Social Drivers of Health    Financial Resource Strain: Low Risk  (03/16/2021)   [facility] to [facility]18/[facility]   Overall Financial Resource Strain (CARDIA)    Difficulty of Paying Living Expenses: Not very hard  Food Insecurity: No Food Insecurity (10/19/2023)   Hunger Vital Sign    Worried About Running Out of Food in the Last Year: Never true    Ran Out of Food in the Last Year: Never true  Transportation Needs: No Transportation Needs (10/19/2023)   PRAPARE - Administrator, Civil Service (Medical): No    Lack of Transportation (Non-Medical): No  Physical Activity: Inactive (03/16/2021)   [facility] to [facility]18/20[facility]   Exercise Vital Sign    On average, how many days per week do you engage in moderate to strenuous exercise (like a brisk walk)?: 0 days    On average, how many minutes do you engage in exercise at this level?: 0 min  Stress: Stress Concern Present (03/16/2021)   [facility] to [facility]   [employer] of Occupational Health - Occupational Stress Questionnaire    Feeling of Stress : To some extent  Social Connections: Moderately Integrated (10/19/2023)   Social Connection and Isolation Panel    Frequency of Communication with Friends and Family: Once a week    Frequency of Social Gatherings with Friends and Family:  Twice a week    Attends Religious Services: 1 to 4 times per year    Active Member of Clubs or Organizations: No    Attends Engineer, structural: Never    Marital Status: Married   Past Medical History:  Past Medical History:  Diagnosis Date   Anxiety    Borderline diabetic    Hypertension    Pre-diabetes     Past Surgical History:  Procedure Laterality Date   HERNIA REPAIR     Family History:  Family History  Problem Relation Age of Onset   Diabetes Mother    Diabetes  Father    Heart failure Father    Schizophrenia Paternal Grandmother     Hospital Course:  Patient is 58 year old male admitted to the inpatient unit voluntarily. On admission, patient reported increased anxiety, depression and paranoia that has increased in intensity over the past two weeks. He also reported increased stressors including ongoing struggle with pornography addiction, recent death of father in law and his own father's physical health declining. He reported his addiction with pornography has been the worst it's ever been. He reported it initially started with clicking links on Facebook, but has progressed to him searching on the internet for the material. Detailed risk assessment is complete based on clinical exam and individual risk factors and acute suicide risk is low and acute violence risk is low.    on admission patient was started on Prozac  and Seroquel  to help with the impulse control.  Dosage was optimized and patient responded very well to the medications with no reported side effects.  He maintained safe behaviors.  On the day of discharge he denied SI/HI/plan and denied hallucinations.  He remains future oriented and responding to participate in outpatient mental health services.   Currently, all modifiable risk of harm to self/harm to others have been addressed and patient is no longer appropriate for the acute inpatient setting and is able to continue treatment for mental health needs in the community with the supports as indicated below.  Patient is educated and verbalized understanding of discharge plan of care including medications, follow-up appointments, mental health resources and further crisis services in the community.  He is instructed to call 911 or present to the nearest emergency room should he experience any decompensation in mood, disturbance of bowel or return of suicidal/homicidal ideations.  Patient verbalizes understanding of this education and agrees to  this plan of care  Physical Findings: AIMS:  , ,  ,  ,    CIWA:    COWS:        Psychiatric Specialty Exam:  Presentation  General Appearance:  Appropriate for Environment; Casual  Eye Contact: Fair  Speech: Clear and Coherent  Speech Volume: Normal    Mood and Affect  Mood: Euthymic  Affect: Appropriate   Thought Process  Thought Processes: Coherent  Descriptions of Associations:Intact  Orientation:Full (Time, Place and Person)  Thought Content:Logical  Hallucinations:Hallucinations: None  Ideas of Reference:None  Suicidal Thoughts:Suicidal Thoughts: No  Homicidal Thoughts:Homicidal Thoughts: No   Sensorium  Memory: Immediate Fair; Recent Fair; Remote Fair  Judgment: Fair  Insight: Fair   Art therapist  Concentration: Fair  Attention Span: Fair  Recall: [employer] of Knowledge: Fair  Language: Fair   Psychomotor Activity  Psychomotor Activity: Psychomotor Activity: Normal  Musculoskeletal: Strength & Muscle Tone: within normal limits Gait & Station: normal Assets  Assets: Manufacturing systems engineer; Desire for Improvement   Sleep  Sleep:  Sleep: Fair    Physical Exam: Physical Exam ROS Blood pressure (!) 122/93, pulse 89, temperature 98 F (36.7 C), temperature source Temporal, resp. rate 18, height 5' 8.5 (1.74 m), weight 95.9 kg, SpO2 99%. Body mass index is 31.69 kg/m.   Social History   Tobacco Use  Smoking Status Never  Smokeless Tobacco Never   Tobacco Cessation:  N/A, patient does not currently use tobacco products   Blood Alcohol level:  Lab Results  Component Value Date   [facility] <15 10/19/2023    Metabolic Disorder Labs:  Lab Results  Component Value Date   HGBA1C 6.0 (H) 10/19/2023   MPG 125.5 10/19/2023   No results found for: PROLACTIN Lab Results  Component Value Date   CHOL 172 10/19/2023   TRIG 127 10/19/2023   HDL 52 10/19/2023   CHOLHDL 3.3 10/19/2023   VLDL 25  10/19/2023   LDLCALC 95 10/19/2023    See Psychiatric Specialty Exam and Suicide Risk Assessment completed by Attending Physician prior to discharge.  Discharge destination:  Home  Is patient on multiple antipsychotic therapies at discharge:  No   Has Patient had three or more failed trials of antipsychotic monotherapy by history:  No  Recommended Plan for Multiple Antipsychotic Therapies: NA   Allergies as of 10/25/2023   No Known Allergies      Medication List     TAKE these medications      Indication  FLUoxetine  20 MG capsule Commonly known as: PROZAC  Take 1 capsule (20 mg total) by mouth daily. Start taking on: October 26, 2023  Indication: Depression   irbesartan  150 MG tablet Commonly known as: AVAPRO  Take 1 tablet (150 mg total) by mouth daily.    metformin  1000 MG (OSM) 24 hr tablet Commonly known as: FORTAMET  Take 1 tablet (1,000 mg total) by mouth 2 (two) times daily with a meal.    multivitamin with minerals Tabs tablet Take 1 tablet by mouth daily. Start taking on: October 26, 2023    QUEtiapine  50 MG tablet Commonly known as: SEROQUEL  Take 1 tablet (50 mg total) by mouth at bedtime.  Indication: Generalized Anxiety Disorder, Trouble Sleeping        Follow-up Information     [facility] Follow up.   Specialty: Behavioral Health Why: Appointment is scheduled for Tuesday October 7 at 1PM.  Appointment is face to face. Contact information: [address] [address] [place]  [address] [phone]                Follow-up recommendations:  Activity:  As tolerated    Signed: Javione Gunawan, MD 10/25/2023, 5:11 PM

## 2023-10-25 NOTE — Progress Notes (Signed)
  [facility] Adult Case Management Discharge Plan :  Will you be returning to the same living situation after discharge:  Yes,  pt reports that he is returning home. At discharge, do you have transportation home?: Yes,  pt reports that his wife will provide transportation.  Do you have the ability to pay for your medications: Yes,  BLUE CROSS BLUE SHIELD / BCBS COMM PPO  Release of information consent forms completed and in the chart;  Patient's signature needed at discharge.  Patient to Follow up at:  Follow-up Information     [facility] Follow up.   Specialty: Behavioral Health Why: Appointment is scheduled for Tuesday October 7 at 1PM.  Appointment is face to face. Contact information: [address] [place]  [address] [phone]                Next level of care provider has access to [facility] Link:no  Safety Planning and Suicide Prevention discussed: Yes,  SPE completed with the patients wife.      Has patient been referred to the Quitline?: Patient does not use tobacco/nicotine products  Patient has been referred for addiction treatment: No known substance use disorder.  Sherryle JINNY Margo, LCSW 10/25/2023, 3:25 PM

## 2023-10-25 NOTE — [facility] Suicide Risk Assessment (Signed)
 [facility] INPATIENT:  Family/Significant Other Suicide Prevention Education  Suicide Prevention Education:  Education Completed; Pasha Broad, wife, [phone] ,  (name of family member/significant other) has been identified by the patient as the family member/significant other with whom the patient will be residing, and identified as the person(s) who will aid the patient in the event of a mental health crisis (suicidal ideations/suicide attempt).  With written consent from the patient, the family member/significant other has been provided the following suicide prevention education, prior to the and/or following the discharge of the patient.  The suicide prevention education provided includes the following: Suicide risk factors Suicide prevention and interventions National Suicide Hotline telephone number [facility] assessment telephone number [facility] Emergency Assistance 911 [facility] and/or Residential Mobile Crisis Unit telephone number  Request made of family/significant other to: Remove weapons (e.g., guns, rifles, knives), all items previously/currently identified as safety concern.   Remove drugs/medications (over-the-counter, prescriptions, illicit drugs), all items previously/currently identified as a safety concern.  The family member/significant other verbalizes understanding of the suicide prevention education information provided.  The family member/significant other agrees to remove the items of safety concern listed above.  Sherryle JINNY Margo 10/25/2023, 11:36 AM

## 2023-10-25 NOTE — Plan of Care (Signed)
  Problem: Education: Goal: Knowledge of [facility] Education information/materials will improve Outcome: Adequate for Discharge Goal: Emotional status will improve Outcome: Adequate for Discharge Goal: Mental status will improve Outcome: Adequate for Discharge Goal: Verbalization of understanding the information provided will improve Outcome: Adequate for Discharge   Problem: Activity: Goal: Interest or engagement in activities will improve Outcome: Adequate for Discharge Goal: Sleeping patterns will improve Outcome: Adequate for Discharge   Problem: Health Behavior/Discharge Planning: Goal: Identification of resources available to assist in meeting health care needs will improve Outcome: Adequate for Discharge Goal: Compliance with treatment plan for underlying cause of condition will improve Outcome: Adequate for Discharge   Problem: Coping: Goal: Level of anxiety will decrease Outcome: Adequate for Discharge

## 2023-10-25 NOTE — [facility] Counselor (Signed)
  CSW spoke with Judea Riches, wife, [phone],   Wife reports that pt was paranoid and worried that he would lose his job.  She reports that the patient reported that the new system at work was failing.    Wife reports that the patient was hospitalized for 20 years ago for watching pornography.  Wife reports the guilt, depression has led to his current state.    Wife reports that the weapons have been moved to stop patient from having access.  She reports that they're locked up and he doesn't have access to them.    Wife reports that she does not believe that the patients job is even aware of what has occurred. She reports that at last admission, patient was paranoid that the FBI was even following him because of it.   Wife reports that she does not feel that pt is a danger to self or others.    Wife reports he's a little bit more focused, he seems more normal in some ways but he still scared and still has that fear.  Patient's wife reports that He told me last night that he needs to get out and face what ever he has to face.   Wife reports that the home is broken and reports that I will always love him but I can't be with him.   Sherryle Margo, MSW, LCSW 10/25/2023 11:33 AM

## 2023-10-25 NOTE — [facility] Suicide Risk Assessment (Signed)
 [facility] Discharge Suicide Risk Assessment   Principal Problem: MDD (major depressive disorder), recurrent, severe, with psychosis (HCC) Discharge Diagnoses: Principal Problem:   MDD (major depressive disorder), recurrent, severe, with psychosis (HCC)   Total Time spent with patient: 30 minutes  Musculoskeletal: Strength & Muscle Tone: within normal limits Gait & Station: normal Patient leans: N/A  Psychiatric Specialty Exam  Presentation  General Appearance:  Appropriate for Environment; Casual  Eye Contact: Fair  Speech: Clear and Coherent  Speech Volume: Normal  Handedness: Right   Mood and Affect  Mood: Euthymic  Duration of Depression Symptoms: Greater than two weeks  Affect: Appropriate   Thought Process  Thought Processes: Coherent  Descriptions of Associations:Intact  Orientation:Full (Time, Place and Person)  Thought Content:Logical  History of Schizophrenia/Schizoaffective disorder:No  Duration of Psychotic Symptoms:Less than six months  Hallucinations:Hallucinations: None  Ideas of Reference:None  Suicidal Thoughts:Suicidal Thoughts: No  Homicidal Thoughts:Homicidal Thoughts: No   Sensorium  Memory: Immediate Fair; Recent Fair; Remote Fair  Judgment: Fair  Insight: Fair   Art therapist  Concentration: Fair  Attention Span: Fair  Recall: [employer] of Knowledge: Fair  Language: Fair   Psychomotor Activity  Psychomotor Activity: Psychomotor Activity: Normal   Assets  Assets: Communication Skills; Desire for Improvement   Sleep  Sleep: Sleep: Fair  Estimated Sleeping Duration (Last 24 Hours): 5.00-6.50 hours  Physical Exam: Physical Exam ROS Blood pressure (!) 122/93, pulse 89, temperature 98 F (36.7 C), temperature source Temporal, resp. rate 18, height 5' 8.5 (1.74 m), weight 95.9 kg, SpO2 99%. Body mass index is 31.69 kg/m.  Mental Status Per Nursing Assessment::   On Admission:   NA  Demographic Factors:  Male  Loss Factors: Decrease in vocational status  Historical Factors: Impulsivity  Risk Reduction Factors:   Living with another person, especially a relative, Positive social support, Positive therapeutic relationship, and Positive coping skills or problem solving skills  Continued Clinical Symptoms:  Depression:   Impulsivity  Cognitive Features That Contribute To Risk:  None    Suicide Risk:  Minimal: No identifiable suicidal ideation.  Patients presenting with no risk factors but with morbid ruminations; may be classified as minimal risk based on the severity of the depressive symptoms   Follow-up Information     [facility] Follow up.   Specialty: Behavioral Health Why: Appointment is scheduled for Tuesday October 7 at 1PM.  Appointment is face to face. Contact information: [address] [place]  [address] [phone]                Plan Of Care/Follow-up recommendations:  Activity:  As tolerated  Tyrea Froberg, MD 10/25/2023, 5:07 PM

## 2023-10-25 NOTE — Group Note (Signed)
 Physical/Occupational Therapy Group Note  Group Topic: Functional, Dynamic Balance   Group Date: 10/25/2023 Start Time: 1302 End Time: 1332 Facilitators: Draedyn Weidinger, Alm Hamilton, PT   Group Description: Group discussed impact of balance on safety and independence with functional tasks.  Identified and discussed any self-perceived balance deficits to personalize information.  Discussed and reviewed strategies to address/improve balance deficits: use of assist devices, activity pacing/energy conservation, environment/home safety modifications, focusing attention/minimizing distraction.  Reviewed and participated with standing LE therex designed to target dynamic balance reactions and LE strength/stability; provided handouts with HEP to be utilized outside of group time as appropriate.  Allowed time for questions and further discussion on any balance or mobility concerns/needs.  Therapeutic Goal(s):  Identify and discuss any individual balance deficits and functional implications. Identify and discuss any environmental/home safety modifications that can optimize balance and safety for mobility within the home. Demonstrate understanding and performance of standing therex designed to target dynamic balance deficits.  Individual Participation: Pt was quietly observant during the session with occasional participation during the activity portion of the session.   Participation Level: Minimal and Moderate   Participation Quality: Minimal Cues   Behavior: Appropriate   Speech/Thought Process: Difficult to assess, quietly observant during the session with little to no verbal communication   Affect/Mood: Appropriate   Insight: Good   Judgement: Good   Modes of Intervention: Activity, Discussion, and Education  Patient Response to Interventions:  Attentive and Interested    Plan: Continue to engage patient in PT/OT groups 1 - 2x/week.  CHARM Hamilton Bertin PT, DPT 10/25/23, 2:02 PM

## 2023-10-25 NOTE — Group Note (Signed)
 Recreation Therapy Group Note   Group Topic:Leisure Education  Group Date: 10/25/2023 Start Time: 1400 End Time: 1500 Facilitators: Celestia Jeoffrey BRAVO, LRT, CTRS Location: Courtyard  Group Description: Music. Patients encouraged to name their favorite song(s) for LRT to play song through speaker for group to hear, while in the courtyard getting fresh air and sunlight. Patients educated on the definition of leisure and the importance of having different leisure interests outside of the hospital. 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    Goal Area(s) Addressed:  Patient will identify a current leisure interest.  Patient will practice making a positive decision. Patient will have the opportunity to try a new leisure activity.   Affect/Mood: Flat   Participation Level: Minimal   Participation Quality: Independent   Behavior: Cooperative   Speech/Thought Process: Coherent   Insight: Fair   Judgement: Fair    Modes of Intervention: Guided Discussion, Music, Socialization, and Support   Patient Response to Interventions:  Attentive, Engaged, Interested , and Receptive   Education Outcome:  Acknowledges education   Clinical Observations/Individualized Feedback: Phillip [name] was somewhat active in their participation of session activities and group discussion. Pt chose to walk around the courtyard while in group. Pt minimally interacted with LRT and peers while present.   Plan: Continue to engage patient in RT group sessions 2-3x/week.   Jeoffrey BRAVO Celestia, LRT, CTRS 10/25/2023 4:42 PM

## 2023-10-25 NOTE — Progress Notes (Signed)
   10/25/23 0748  Psych Admission Type (Psych Patients Only)  Admission Status Voluntary  Psychosocial Assessment  Patient Complaints None  Eye Contact Brief  Facial Expression Sad  Affect Sad  Speech Logical/coherent  Interaction Minimal  Motor Activity Slow  Appearance/Hygiene Unremarkable  Behavior Characteristics Appropriate to situation;Cooperative  Mood Pleasant  Aggressive Behavior  Effect No apparent injury  Thought Process  Coherency WDL  Content WDL  Delusions None reported or observed  Perception WDL  Hallucination None reported or observed  Judgment Limited  Confusion None  Danger to Self  Current suicidal ideation? Denies  Agreement Not to Harm Self Yes  Description of Agreement Verbal  Danger to Others  Danger to Others None reported or observed

## 2023-11-22 NOTE — Progress Notes (Signed)
 Date of Service: 11/22/2023 Patient DOB: 05/01/1965   Subjective:     Patient ID: Phillip [name] is a 58 y.o. male. Patient comes in for Annual Exam (Fasting, decline shingrix, pneumonia and flu shot/), Hypertension, Diabetes, and Hyperlipidemia The patient is a 58yo male who presents for annual physical and medical evaluation. The patient has a history of:        -Diabetes: on metformin  1000 mg twice a day, Ozempic 1mg  weekly, currently has no GI issues,    12/22/21 222lbs    08/11/22: 226lbs     05/19/23 (226 lbs).      11/22/23 (212lbs) -14lbs                  A1C:     Lab Results    Component Value Date    HGBA1C 6.4 (H) 08/11/2022    04/25 A1c 6.3             Lipids:     Lab Results    Component Value Date    CHOL 128 08/11/2022    CHOL 148 03/17/2021         Lab Results    Component Value Date    HDL 47 (L) 08/11/2022    HDL 48 03/17/2021         Lab Results    Component Value Date    LDLCALC 59 08/11/2022         Lab Results    Component Value Date    TRIG 132 08/11/2022    TRIG 119 03/17/2021    Microalbumin: due    Component Value Date          ALBUMIN 4.4 03/17/2021        Blood sugars: no          Eye exam: due 05/23, my eye doctor is in [place]           Foot exam:  05/19/23                   -Hypertension: on valsartan 160 mg daily.               -elevated bilirubin:          -Depression/anxiety: Patient was hospitalized on 10/19/2023 with increase in depression/anxiety with paranoia that have been progressing over 2 weeks with increases in stressors including struggling with pornography, and death of father-in-law, his own father's physical decline.  Patient was placed on Prozac  10  mg (was on 20mg  daily but had sleep issues) daily and Seroquel  50 mg nightly and was later d/c home.   11/22/23 pt is doing well with this medications. Pt was supposed to see psychiatrist tomorrow and had  to be rescheduled until 12/14/23. No thoughts about hurting himself or others right now.            C-scope: 04/08/2021 negative Cologuard, no bloody/black stools.         PSA: due, 08/11/2022 (0.97 from 0.70)        Dexa: no recent bone fractures.         Vaccinations: Currently due for tetanus, shingles, Prevnar 20, and flu vaccines (declined all).       Hep C screening: 01/23 neg        Labs:     09/25 CBC had elevated hemoglobin at 17.9, CMP had elevated blood sugars of 101, elevated creatinine 1.65, A1c of 6.0, negative blood alcohol, lipids had LDL of 95, triglycerides of  127, normal TSH, UDS came back positive for benzos.   04/25 A1c 6.3   06/24 CBC, cholesterol, and PSA labs are normal.  Patient's BMP labs showed slightly elevated blood sugar and would recommend to decrease the carbohydrates in his diet and to stay active.  Patient's total bilirubin was slightly elevated (1.1 from 0.9) which has not changed much from the prior lab, A1c finally came back and has improved from 8.2-6.4.         03/18/21 CBC, cholesterol, hepatitis C, PSA, microalbumin labs were normal. Patient CMP had elevated blood sugars with an A1c of 8.2 meaning that patient's diabetes is not controlled.         Misc:    Review of Systems Review of Systems  Constitutional:  Negative for chills, fatigue and fever.  HENT:  Negative for congestion, hearing loss, rhinorrhea and sore throat.   Eyes:  Negative for pain and visual disturbance.  Respiratory:  Negative for cough, shortness of breath and wheezing.   Cardiovascular:  Negative for chest pain, palpitations and leg swelling.  Gastrointestinal:  Negative for abdominal distention, abdominal pain, anal bleeding, blood in stool, constipation, diarrhea, nausea, rectal pain and vomiting.  Genitourinary:  Negative for decreased urine volume, dysuria and hematuria.  Musculoskeletal:  Negative for arthralgias, back pain, gait problem, joint  swelling, myalgias, neck pain and neck stiffness.  Skin:  Negative for rash and wound.  Neurological:  Negative for dizziness, seizures, syncope, weakness and headaches.  Hematological:  Negative for adenopathy. Does not bruise/bleed easily.  Psychiatric/Behavioral:  Negative for suicidal ideas.        + Anxiety/depression     Medical History[1]  Surgical History[2]    Allergies[3]    Social History   Social History Narrative  . Not on file    Family History[4]    Objective:  BP 127/83   Pulse 63   Temp 98.2 F (36.8 C) (Oral)   Resp 16   Ht 1.711 m (5' 7.36)   Wt 96.4 kg (212 lb 9.6 oz)   SpO2 97%   BMI 32.94 kg/m   Physical Exam Physical Exam Constitutional:      General: He is not in acute distress.    Appearance: Normal appearance. He is not ill-appearing.  HENT:     Head: Normocephalic and atraumatic.     Right Ear: Tympanic membrane, ear canal and external ear normal. There is no impacted cerumen.     Left Ear: Tympanic membrane, ear canal and external ear normal. There is no impacted cerumen.     Nose: Nose normal. No congestion or rhinorrhea.     Mouth/Throat:     Mouth: Mucous membranes are moist.     Pharynx: No oropharyngeal exudate or posterior oropharyngeal erythema.  Eyes:     General: No scleral icterus.       Right eye: No discharge.        Left eye: No discharge.     Extraocular Movements: Extraocular movements intact.     Conjunctiva/sclera: Conjunctivae normal.     Pupils: Pupils are equal, round, and reactive to light.  Neck:     Vascular: No carotid bruit.  Cardiovascular:     Rate and Rhythm: Normal rate and regular rhythm.     Heart sounds: No murmur heard. Pulmonary:     Effort: Pulmonary effort is normal. No respiratory distress.     Breath sounds: Normal breath sounds. No stridor. No wheezing, rhonchi or rales.  Chest:  Chest wall: No tenderness.  Abdominal:     General: There is no distension.     Palpations:  Abdomen is soft. There is no mass.     Tenderness: There is no abdominal tenderness. There is no right CVA tenderness, left CVA tenderness, guarding or rebound.     Hernia: No hernia is present.  Musculoskeletal:        General: No swelling, tenderness, deformity or signs of injury.     Cervical back: Neck supple. No tenderness.     Right lower leg: No edema.     Left lower leg: No edema.  Lymphadenopathy:     Cervical: No cervical adenopathy.  Skin:    Coloration: Skin is not jaundiced or pale.     Findings: No bruising, erythema, lesion or rash.  Neurological:     General: No focal deficit present.     Mental Status: He is alert and oriented to person, place, and time.     Cranial Nerves: No cranial nerve deficit.     Motor: No weakness.     Gait: Gait normal.  Psychiatric:        Mood and Affect: Mood normal.        Behavior: Behavior normal.        Thought Content: Thought content normal.        Judgment: Judgment normal.         Labs: No results found for this or any previous visit (from the past week).  Assessment/Plan:  1. Physical exam (Primary) Obtain fasting labs now. - CBC with Differential  2. Type 2 diabetes mellitus without complication, without long-term current use of insulin   Obtain labs now.  Continue current medications. Recommend a low fat/carbohydrate diet with increase in exercising.  Follow-up with your eye doctor for annual diabetic eye exam.  - Comprehensive Metabolic Panel - Lipid Panel - Hemoglobin A1C With Estimated Average Glucose - Albumin, Random Urine  3. Essential hypertension controlled, continue current medication   4. Elevated bilirubin Obtain CMP lab now.  5. Anxiety and depression Currently controlled, continue medication and follow with psychiatry as directed. - QUEtiapine  (SeroqueL ) 50 mg tablet; Take 1 tablet (50 mg total) by mouth at bedtime.  Dispense: 30 tablet; Refill: 0  6. Prostate cancer screening Obtain  screening PSA lab now.  Obtain labs (CBC, CMP, lipids, A1c, PSA, microalbumin) now.  Follow-up in 6 months or sooner if needed.  Call for any questions/concerns.  Electronically signed by: Shawn P Lazoff, DO 11/22/2023 4:13 PM  This document was created using the aid of voice recognition Dragon dictation software.        [1] Past Medical History: Diagnosis Date  . Anxiety   . Hypertension   [2] Past Surgical History: Procedure Laterality Date  . HERNIA REPAIR     Procedure: HERNIA REPAIR  [3] No Known Allergies [4] Family History Problem Relation Name Age of Onset  . Heart disease Father G Hoelzer   . Hearing loss Father G Dain   . Diabetes Father G Petit   . Hypertension Other    . Diabetes Other    . Stroke Other    . Fibromyalgia Mother

## 2023-11-23 ENCOUNTER — Telehealth (HOSPITAL_COMMUNITY): Admitting: Registered Nurse

## 2023-11-24 NOTE — Progress Notes (Signed)
 Please let patient know that his CBC, CMP, and microalbumin labs are normal.  Patient's A1c is 6.4 which is at goal of being below 7.0 and will continue current medications.  Patient's cholesterol had mildly elevated triglycerides (170) and we will continue to observe but recommend a low-fat/low carbohydrate diet with increasing exercise.  Call for any questions/concerns.

## 2023-12-14 ENCOUNTER — Other Ambulatory Visit: Payer: Self-pay

## 2023-12-14 ENCOUNTER — Encounter (HOSPITAL_COMMUNITY): Payer: Self-pay | Admitting: Psychiatry

## 2023-12-14 ENCOUNTER — Ambulatory Visit (HOSPITAL_COMMUNITY): Admitting: Psychiatry

## 2023-12-14 VITALS — BP 114/81 | HR 72 | Ht 68.5 in

## 2023-12-14 DIAGNOSIS — F5102 Adjustment insomnia: Secondary | ICD-10-CM | POA: Diagnosis not present

## 2023-12-14 DIAGNOSIS — F332 Major depressive disorder, recurrent severe without psychotic features: Secondary | ICD-10-CM

## 2023-12-14 DIAGNOSIS — F333 Major depressive disorder, recurrent, severe with psychotic symptoms: Secondary | ICD-10-CM

## 2023-12-14 DIAGNOSIS — F411 Generalized anxiety disorder: Secondary | ICD-10-CM | POA: Diagnosis not present

## 2023-12-14 MED ORDER — FLUOXETINE HCL 20 MG PO CAPS: 20.0000 mg | ORAL_CAPSULE | Freq: Every day | ORAL | 0 refills | Status: DC

## 2023-12-14 MED ORDER — QUETIAPINE FUMARATE 50 MG PO TABS: 50.0000 mg | ORAL_TABLET | Freq: Every day | ORAL | 0 refills | Status: DC

## 2023-12-14 NOTE — Progress Notes (Signed)
 Psychiatric Initial Adult Assessment   Patient Identification: Phillip [name] MRN:  [id number] Date of Evaluation:  12/14/2023 Referral Source: hospital discharge Chief Complaint:   Chief Complaint  Patient presents with   Establish Care   Visit Diagnosis:    ICD-10-CM   1. MDD (major depressive disorder), recurrent, severe, with psychosis (HCC)  F33.3     2. GAD (generalized anxiety disorder)  F41.1     3. Adjustment insomnia  F51.02       History of Present Illness: Patient is a 58 years old currently married Caucasian male referred at hospital discharge admitted at [facility] a month ago because of worsening of depression exacerbation of paranoia.  He currently works as a nature conservation officer in a company  Patient states has been going through some exacerbated stressors prior to hospital admission including porn addiction job stress and also relationship with getting infected and they were recently separated prior to admission he was having difficulty sleeping poor sleep with paranoia got exacerbated as a police was watching him and he was feeling down withdrawn and depression got decompensated which brought him to the hospital.  He has been diagnosed with depression with psychosis started on Prozac  and Seroquel  discharge summary and hospital chart reviewed  Patient has been doing reasonable since hospital admission and discharged he states he is sleeping reasonable but sometimes difficult waking up in the morning because of Seroquel  dose.  He does not have any tremors or side effects from the medication takes scribes depression to be manageable does not endorse excessive anxiety as well prior to admission his anxiety was getting worse and excessive denies having any weight changes sleep concerns as of now.  Does not endorse hopelessness helplessness he does have some sad days but not hopelessness or withdrawn feeling.  Does not endorse excessive worries either.  Does not endorse  any manic symptoms currently or in the past  Denies drug use  He is currently living with his parents because they are separated he keeps in touch with his daughters who are grown.  He is trying to work through the process of getting together with his wife.  Porn addiction has been a concern in the past even at 2005 that led him to have difficulty in the marriage and also there was a newborn baby at that time that led him to have hospital admission  He is cut down his phone addiction and working on it the medication is helping as well in regard to his mood symptoms anxiety and sleep  Aggravating factors; separation, porn addiction, job stress  Modifying factors kids  Duration to hospital admission first 1 was in 2005 last 1 was 1 month ago in between he has stopped taking medication he was feeling better prior to this recent exacerbation of depression  Labs reviewed he is on blood pressure medication and metformin  for diabetes or prediabetes.  He is following up with his primary care physician regarding to his labs including LDL  Past Psychiatric History: depression  Previous Psychotropic Medications: Yes   Substance Abuse History in the last 12 months:  No.  Consequences of Substance Abuse: NA  Past Medical History:  Past Medical History:  Diagnosis Date   Anxiety    Borderline diabetic    Hypertension    Pre-diabetes     Past Surgical History:  Procedure Laterality Date   HERNIA REPAIR      Family Psychiatric History: grand mother: schizophrenia  Family History:  Family History  Problem Relation Age of Onset   Diabetes Mother    Diabetes Father    Heart failure Father    Schizophrenia Paternal Grandmother     Social History:   Social History   Socioeconomic History   Marital status: Married    Spouse name: Not on file   Number of children: Not on file   Years of education: Not on file   Highest education level: Not on file  Occupational History   Not on  file  Tobacco Use   Smoking status: Never   Smokeless tobacco: Never  Vaping Use   Vaping status: Never Used  Substance and Sexual Activity   Alcohol use: Never   Drug use: Never   Sexual activity: Not on file  Other Topics Concern   Not on file  Social History Narrative   Not on file   Social Drivers of Health   Financial Resource Strain: Low Risk  (03/16/2021)   Received from [facility] visits prior to 04/18/2022., [facility]   Overall Financial Resource Strain (CARDIA)    Difficulty of Paying Living Expenses: Not very hard  Food Insecurity: Unknown (11/21/2023)   Received from [facility]   Hunger Vital Sign    Within the past 12 months, you worried that your food would run out before you got money to buy more: Patient declined to answer    Within the past 12 months, the food you bought just didn't last and you didn't have money to get more. : Patient declined to answer  Transportation Needs: Not on file (11/21/2023)  Physical Activity: Inactive (03/16/2021)   Received from [facility] visits prior to 04/18/2022., [facility]   Exercise Vital Sign    On average, how many days per week do you engage in moderate to strenuous exercise (like a brisk walk)?: 0 days    On average, how many minutes do you engage in exercise at this level?: 0 min  Stress: Stress Concern Present (03/16/2021)   Received from [facility] visits prior to 04/18/2022., [facility]   [employer] of Occupational Health - Occupational Stress Questionnaire    Feeling of Stress : To some extent  Social Connections: Moderately Integrated (10/19/2023)   Social Connection and Isolation Panel    Frequency of Communication with Friends and Family: Once a week    Frequency of Social Gatherings with Friends and Family: Twice a week    Attends Religious Services: 1 to 4 times per year    Active Member of Clubs or Organizations: No    Attends Occupational Hygienist Meetings: Never    Marital Status: Married    Additional Social History: Patient grew up with his parents no trauma or abuse currently married for 29 years has 3 grown daughters  Allergies:  No Known Allergies  Metabolic Disorder Labs: Lab Results  Component Value Date   HGBA1C 6.0 (H) 10/19/2023   MPG 125.5 10/19/2023   No results found for: PROLACTIN Lab Results  Component Value Date   CHOL 172 10/19/2023   TRIG 127 10/19/2023   HDL 52 10/19/2023   CHOLHDL 3.3 10/19/2023   VLDL 25 10/19/2023   LDLCALC 95 10/19/2023   Lab Results  Component Value Date   TSH 2.398 10/19/2023    Therapeutic Level Labs: No results found for: LITHIUM No results found for: CBMZ No results found for: VALPROATE  Current Medications: Current Outpatient Medications  Medication Sig Dispense  Refill   irbesartan  (AVAPRO ) 150 MG tablet Take 1 tablet (150 mg total) by mouth daily.     metFORMIN  (FORTAMET ) 1000 MG (OSM) 24 hr tablet Take 1 tablet (1,000 mg total) by mouth 2 (two) times daily with a meal.     Semaglutide (OZEMPIC, 2 MG/DOSE, [place]) Inject 2 mg into the skin once a week.     FLUoxetine  (PROZAC ) 20 MG capsule Take 1 capsule (20 mg total) by mouth daily. 30 capsule 0   Multiple Vitamin (MULTIVITAMIN WITH MINERALS) TABS tablet Take 1 tablet by mouth daily. (Patient not taking: Reported on 12/14/2023) 30 tablet 0   QUEtiapine  (SEROQUEL ) 50 MG tablet Take 1 tablet (50 mg total) by mouth at bedtime. 30 tablet 0   No current facility-administered medications for this visit.     Psychiatric Specialty Exam: Review of Systems  Cardiovascular:  Negative for chest pain.  Neurological:  Negative for tremors.  Psychiatric/Behavioral:  Negative for self-injury.     Blood pressure 114/81, pulse 72, height 5' 8.5 (1.74 m), SpO2 97%.Body mass index is 31.69 kg/m.  General Appearance: Casual  Eye Contact:  Good  Speech:  Clear and Coherent  Volume:  Normal  Mood:   Euthymic  Affect:  Congruent  Thought Process:  Goal Directed  Orientation:  Full (Time, Place, and Person)  Thought Content:  Rumination  Suicidal Thoughts:  No  Homicidal Thoughts:  No  Memory:  Immediate;   Fair  Judgement:  Fair  Insight:  Fair  Psychomotor Activity:  Normal  Concentration:  Concentration: Fair  Recall:  Good  Fund of Knowledge:Good  Language: Good  Akathisia:  No  Handed:    AIMS (if indicated):  no involuntary movements  Assets:  Desire for Improvement  ADL's:  Intact  Cognition: WNL  Sleep:  variable    Screenings: AUDIT    Flowsheet Row Admission (Discharged) from 10/19/2023 in [facility]  Alcohol Use Disorder Identification Test Final Score (AUDIT) 0   GAD-7    Flowsheet Row Office Visit from 12/14/2023 in [facility]  Total GAD-7 Score 5   PHQ2-9    Flowsheet Row Office Visit from 12/14/2023 in [facility]  PHQ-2 Total Score 2  PHQ-9 Total Score 9   Flowsheet Row Office Visit from 12/14/2023 in [facility] at [facility] Most recent reading at 12/14/2023 11:06 AM Admission (Discharged) from 10/19/2023 in [facility] Most recent reading at 10/24/2023  1:16 PM ED from 10/19/2023 in [facility] Most recent reading at 10/19/2023  6:24 PM  C-SSRS RISK CATEGORY Error: Q3, 4, or 5 should not be populated when Q2 is No No Risk No Risk    Assessment and Plan: as follows  Major depressive disorder recurrent moderate to severe; doing reasonable since hospital admission continue Prozac  20 mg continue Seroquel  50 mg there is no EPS or involuntary movements noticeable to recommend therapy to work on his stressors including relationship, stressors and porn addiction, job stresors   Generalized anxiety disorder manageable and improving  continue Prozac   Recommend therapy to work on his stressors see above  Insomnia; adjustment insomnia; improving and improved sleep is also improved with his depression and paranoia  Reviewed medication questions addressed chart reviewed follow-up in 1 month or earlier if needed down the road he may cut down the Seroquel  as he does feel a little groggy in the  morning but as of now wants to continue same dose since he has been stabilized  Direct care time 60 minutes with chart review, face-to-face, documentation and collaboration if any Collaboration of Care: Psychiatrist AEB hospital discharge and chart reviewed  Patient/Guardian was advised Release of Information must be obtained prior to any record release in order to collaborate their care with an outside provider. Patient/Guardian was advised if they have not already done so to contact the registration department to sign all necessary forms in order for us  to release information regarding their care.   Consent: Patient/Guardian gives verbal consent for treatment and assignment of benefits for services provided during this visit. Patient/Guardian expressed understanding and agreed to proceed.   Jackey Flight, MD 10/28/202511:17 AM

## 2023-12-20 ENCOUNTER — Telehealth (HOSPITAL_COMMUNITY): Payer: Self-pay | Admitting: Psychiatry

## 2023-12-20 MED ORDER — QUETIAPINE FUMARATE 50 MG PO TABS: 50.0000 mg | ORAL_TABLET | Freq: Every day | ORAL | 0 refills | Status: DC

## 2023-12-20 NOTE — Telephone Encounter (Signed)
 Received fax from patient's pharmacy requesting 90 Day prescription for QUEtiapine  (SEROQUEL ) 50 MG tablet .   [id number] - [place], [place] - [address] (Ph: [phone])   Last ordered: 12/14/2023 - 30 tablets Last visit: 12/14/2023 Next visit: 01/18/2024  Contacted patient and he indicated insurance is driving this request. States he has not picked up the 30 tablet prescription from the pharmacy yet.

## 2024-01-06 ENCOUNTER — Ambulatory Visit (HOSPITAL_COMMUNITY): Admitting: Licensed Clinical Social Worker

## 2024-01-06 DIAGNOSIS — F411 Generalized anxiety disorder: Secondary | ICD-10-CM

## 2024-01-06 DIAGNOSIS — F3340 Major depressive disorder, recurrent, in remission, unspecified: Secondary | ICD-10-CM

## 2024-01-06 NOTE — Progress Notes (Signed)
 Comprehensive Clinical Assessment (CCA) Note  01/07/2024 Phillip [name] [id number]  Chief Complaint:  Chief Complaint  Patient presents with   Depression   Anxiety   Visit Diagnosis: MDD (recurrent major depressive disorder) in remission  GAD (generalized anxiety disorder)      CCA Biopsychosocial Intake/Chief Complaint:  Mood, addictive behavior  Current Symptoms/Problems: Mood: can feel numb at times, increase in appetite, slight weight gain 5-6 lbs,  slight feelings of hopelessness, Anxiety: worrying, slight nervous,  feels looked at or judged, prior to presenting: heart racing, pornography use: started in 2005 but stopped then  it crept in after a few years then got worse over the summer, no pornography use since Sept, no SI/HI, no psychosis   Patient Reported Schizophrenia/Schizoaffective Diagnosis in Past: No   Strengths: good listener, dedicated and loyal, concienious  Preferences: doesn't prefer drama or conflict, prefers to be by himself, prefers to be around his family,  Abilities: good with working with his hands, systems at work,   Type of Services Patient Feels are Needed: Therapy   Initial Clinical Notes/Concerns: Symptoms of anxiety have started in childhood but depressive symptoms started in July when he had work stress, father and father in radiographer, therapeutic health declined, and middle daughter was having a child, symptoms of anxiety are daily, symptoms are moderate per patient., Development:  not a difficulty pregnancy as far as he knows,  not a difficulty birth, possible tobacco use exposure in utero, met milestones on time,  no developmental diagnosis, Medical: type 2 diabetes, high blood pressure, hernia surgery 2010, Family medical issues: Father: heart disease, diabetes, mother has diabetes, fibro myalgia, Mental health: hospitalizaiton in 2005 and Sept 2025, no previous treatment, medication management, MDD, GAD, Family history of mental health: paternal grandmother:  schizophrenia, Housing: lives in a home with his parents in [place], [place], feels safe, has access to food, shelter, clother, seperated form his wife of 30 years and two adult children live with his wife, Legal: no legal issues   Mental Health Symptoms Depression:  Increase/decrease in appetite; Hopelessness (can feel numb)   Duration of Depressive symptoms: Greater than two weeks   Mania:  None   Anxiety:   Worrying; Tension   Psychosis:  None   Duration of Psychotic symptoms: Less than six months   Trauma:  None   Obsessions:  None   Compulsions:  None   Inattention:  N/A   Hyperactivity/Impulsivity:  N/A   Oppositional/Defiant Behaviors:  N/A   Emotional Irregularity:  None   Other Mood/Personality Symptoms:  NA    Mental Status Exam Appearance and self-care  Stature:  Average   Weight:  Average weight   Clothing:  Casual   Grooming:  Normal   Cosmetic use:  None   Posture/gait:  Normal   Motor activity:  Not Remarkable   Sensorium  Attention:  Normal   Concentration:  Normal   Orientation:  X5   Recall/memory:  Normal   Affect and Mood  Affect:  Appropriate   Mood:  Anxious   Relating  Eye contact:  Normal   Facial expression:  Anxious; Depressed   Attitude toward examiner:  Cooperative   Thought and Language  Speech flow: Clear and Coherent   Thought content:  Appropriate to Mood and Circumstances   Preoccupation:  None   Hallucinations:  None   Organization:  No data recorded  [employer] of Knowledge:  Average   Intelligence:  Average   Abstraction:  Normal  Judgement:  Fair   Reality Testing:  Adequate   Insight:  Fair   Decision Making:  Impulsive   Social Functioning  Social Maturity:  Isolates; Responsible   Social Judgement:  Normal   Stress  Stressors:  Illness; Relationship (SA issues)   Coping Ability:  Overwhelmed   Skill Deficits:  Self-control   Supports:  Friends/Service  system; Family     Religion: Religion/Spirituality Are You A Religious Person?: Yes What is Your Religious Affiliation?: Baptist How Might This Affect Treatment?: support in treatment  Leisure/Recreation: Leisure / Recreation Do You Have Hobbies?: Yes Leisure and Hobbies: 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, spend time with family  Exercise/Diet: Exercise/Diet Do You Exercise?: No Have You Gained or Lost A Significant Amount of Weight in the Past Six Months?: Yes-Gained Number of Pounds Gained: 5 Do You Follow a Special Diet?: No Do You Have Any Trouble Sleeping?: Yes Explanation of Sleeping Difficulties: difficulty with staying asleep without medication   CCA Employment/Education Employment/Work Situation: Employment / Work Situation Employment Situation: Employed 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?: No  Education: Education Is Patient Currently Attending School?: No Last Grade Completed: 12 (NA) Name of High School: [employer] Did Garment/textile Technologist From [employer]?: Yes Did Theme Park Manager?: Yes (NA) What Type of College Degree Do you Have?: Bachelor Did You Attend Graduate School?: Yes What is Your Geophysicist/field Seismologist Degree?: Post grade certificate What Was Your Major?: Investment Banker, Corporate Did You Have Any Special Interests In School?: Supply chain management Did You Have An Individualized Education Program (IIEP): No Did You Have Any Difficulty At School?: No Patient's  Education Has Been Impacted by Current Illness: No   CCA Family/Childhood History Family and Relationship History: Family history Marital status: Married Number of Years Married: 30 What types of issues is patient dealing with in the relationship?: His pornography use Additional relationship information: None Are you sexually active?: No What is your sexual orientation?: Heterosexual Has your sexual activity been affected by drugs, alcohol, medication, or emotional stress?: emotional stress can impact Does patient have children?: Yes How many children?: 3 How is patient's relationship with their children?: Joesph: 25, Kelly: 22, Becka: 20: good relationship  Childhood History:  Childhood History By whom was/is the patient raised?: Both parents Additional childhood history information: Both parents in the home. Patient describes childhood as normal but father could be strict. Description of patient's relationship with caregiver when they were a child: Mother: no issues, Father: no issues, father was a optician, dispensing and ex-Marine Patient's description of current relationship with people who raised him/her: Mother:  good,  Father: pretty good but can be stressful and aggrivating, How were you disciplined when you got in trouble as a child/adolescent?: corporal, grounded Does patient have siblings?: Yes Number of Siblings: 1 Description of patient's current relationship with siblings: Joen:  pretty good relationship Did patient suffer any verbal/emotional/physical/sexual abuse as a child?: No Did patient suffer from severe childhood neglect?: No Has patient ever  been sexually abused/assaulted/raped as an adolescent or adult?: No Was the patient ever a victim of a crime or a disaster?: No Witnessed domestic violence?: No Has patient been affected by domestic violence as an adult?: No  Child/Adolescent Assessment:     CCA Substance Use Alcohol/Drug Use: Alcohol / Drug Use Pain  Medications: See MAR Prescriptions: See MAR Over the Counter: See MAR History of alcohol / drug use?: No history of alcohol / drug abuse                         ASAM's:  Six Dimensions of Multidimensional Assessment  Dimension 1:  Acute Intoxication and/or Withdrawal Potential:   Dimension 1:  Description of individual's past and current experiences of substance use and withdrawal: None  Dimension 2:  Biomedical Conditions and Complications:   Dimension 2:  Description of patient's biomedical conditions and  complications: None  Dimension 3:  Emotional, Behavioral, or Cognitive Conditions and Complications:  Dimension 3:  Description of emotional, behavioral, or cognitive conditions and complications: None  Dimension 4:  Readiness to Change:  Dimension 4:  Description of Readiness to Change criteria: None  Dimension 5:  Relapse, Continued use, or Continued Problem Potential:  Dimension 5:  Relapse, continued use, or continued problem potential critiera description: None  Dimension 6:  Recovery/Living Environment:  Dimension 6:  Recovery/Iiving environment criteria description: None  ASAM Severity Score: ASAM's Severity Rating Score: 0  ASAM Recommended Level of Treatment:     Substance use Disorder (SUD)    Recommendations for Services/Supports/Treatments: Recommendations for Services/Supports/Treatments Recommendations For Services/Supports/Treatments: Individual Therapy, Medication Management  DSM5 Diagnoses: Patient Active Problem List   Diagnosis Date Noted   MDD (major depressive disorder), recurrent, severe, with psychosis (HCC) 10/19/2023   Case Summary   Identifying Information: RUSELL MENEELY is a 58 y.o. heterosexual, married, religious male that is currently living separate from his wife and adult children. He is currently employed.   2.  Chief Complaint: Mood, anxiety, Addictive behavior  3. History of present illness: Symptoms of anxiety have started in  childhood but depressive symptoms started in July when he had work stress, father and father in laws health declined, and middle daughter was having a child, symptoms of anxiety are daily, symptoms are moderate per patient.  He confessed to his spouse that he was using pornography and she wanted to separate. This is the second time he has admitted to pornography use with the first being in 2005 which also lead to a hospitalization.           Emotional Symptoms: can feel numb at times, worrying nervous,           Cognitive Symptoms: hopelessness, feels looked at or judged,          Behavioral Symptoms: pornography use      Physiological Symptoms: increase in appetite, slight weight gain, racing heart, shallow breathing         Stressors: work stress, father and father in laws health, separation from his wife  4. Psychiatric History:     []  No previous treatment                    Previous hospitalization             []  No  [x]  Yes  2005 and 2025      Partial Hospitalization Program [x]  No   []  Yes      Intensive Outpatient  Program    [x]  No  []  Yes      Intensive In-home Therapy        [x]  No  []  Yes      Outpatient Therapy                    [x]  No  []  Yes      Medication Management           []  No  [x]  Yes: Dr. Jackey Flight     In-patient Substance Abuse      [x]  No  []  Yes      Treatment     Substance Abuse Intensive      Outpatient Program                   [x]  No  []  Yes      Other mental health treatment   [x]  No  []  Yes   5. Personal and Social History: Both parents in the home. Patient describes childhood as normal but father could be strict. Patient has worked at the same job for 30 years. He has a wife and 3 adult daughters. His previous hospitalizations have been after he shared with his wife his pornography use.   6. Medical History:      Difficult or high risk birth [x]  No []  Unknown  []  Yes       Complications in birth/delivery [x]  No [] Unknown  []  Yes       Met  Milestones on time  [x]  Yes  []  Unknown []   No       Premature [x]  No  []  Unknown  []  Yes       Exposed to medications/drugs/alcohol in the womb []  No []  Unknown []  Yes: possible tobacco use      Severe illness, injury, surgery  []  No  [x]  Yes: hernia surgery in 2010      Allergies (foods, drugs, substances) [x]  No   []  Yes       Chronic medical problems  []  No   [x]  Yes: diabetes, high blood pressure      Significant family medical history []  No []  Unknown [x]  Yes: heart disease, diabetes, fibro myalgia       Significant family mental health history []  No []  Unknown [x]  Yes: schizophrenia      Prior mental health diagnosis  []  No []  Unknown [x]  Yes MDD, GAD      Prior developmental diagnosis [x]  No []  Unknown  []  Yes   7. Mental Health Status Check: Patient was casually dressed, and appropriately groomed. He was alert, engaged, pleasant, and cooperative. Patient was oriented x4 (person, place, situation, and time).   8. ICD-10 or DSM-5 diagnosis:    ICD-10-CM   1. MDD (recurrent major depressive disorder) in remission  F33.40     2. GAD (generalized anxiety disorder)  F41.1        9. Percipients: work stress, pornography use  10: Strengths: good listener, dedicated and loyal, concienious    DIAGNOSTIC CRITERIA FOR Major Depressive Disorder  (DSM-5-TR):  A. Major Depressive Episode  Timeframe: Santiel G [place] has experienced five (or more) symptoms during a two-week period, representing a change in functioning.  Core Symptom (at least one):   []  Depressed mood  [x]  Loss of interest/pleasure.  Additional Symptoms (at least three or four, depending on core symptoms):   [x] Significant weight change or appetite change.       [] Insomnia or hypersomnia. []   Psychomotor agitation or retardation. [] Fatigue or loss of energy. [x] Feelings of worthlessness or excessive guilt. [] Diminished ability to think or concentrate. [] Recurrent thoughts of death or suicidal ideation/behavior.    B. Exclusionary Criteria Symptoms are due to a substance or medical condition.   [x]  No [] Yes  Symptoms are better explained by other psychotic disorders. [x]  No  [] Yes  History of manic or hypomanic episodes.   [x]  No  [] Yes  C. Clinical Significance Symptoms cause significant distress or impairment in functioning. [x]  Yes  D. Final Diagnosis DSM-5 criteria met for Major Depressive Disorder. [x]  Yes []  No  Episode Specifiers:  []  Single Episode  []  Recurrent Episode  Severity Specifiers:  [] Mild  [] Moderate  [] Severe  []  with psychotic features []  in partial remission  [x] in full remission  Other Specifiers:   []  With anxious distress  []  With mixed features []  With melancholic features   []  With atypical features  []  With mood congruent psychotic features   []  With mood incongruent psychotic features  []  With catatonia []  With peripartum onset  []  With seasonal pattern    DIAGNOSIS OF GENERALIZED ANXIETY DISORDER (DSM-5-TR):  Based on clinical interview, FORTINO HAAG  meets diagnostic criteria for Generalized Anxiety Disorder.  [x]  Excessive anxiety and worry: Occurring more days than not for at least 6 months, about a number of events or activities (e.g., work, school, performance).  [x]  Difficult to control the worry  B. Associated symptoms: The anxiety and worry are associated with three (or more) of the following symptoms (only one is required for children), present for more days than not for the past 6 months:  [x]  Restlessness or feeling keyed up or on edge []  Being easily fatigued [x]  Difficulty concentrating or mind going blank []  Irritability [x]  Muscles tension [x] Sleep disturbance (difficulty falling or staying asleep, or restless, unsatisfying sleep)   C. Functional impact: [x]  The anxiety, worry, or physical symptoms cause clinically significant distress or impairment in social, occupational, or or other important areas of functioning.  D. Exclusion  criteria: [x]  The disturbance is not due to the physiological effects of a substance or another medical condition. [x]  The disturbance is not better explained by another mental disorder.    Patient Centered Plan: Patient is on the following Treatment Plan(s):  Anxiety   Referrals to Alternative Service(s): Referred to Alternative Service(s):   Place:   Date:   Time:    Referred to Alternative Service(s):   Place:   Date:   Time:    Referred to Alternative Service(s):   Place:   Date:   Time:    Referred to Alternative Service(s):   Place:   Date:   Time:      Collaboration of Care: Psychiatrist AEB Dr. Jackey Flight  Patient/Guardian was advised Release of Information must be obtained prior to any record release in order to collaborate their care with an outside provider. Patient/Guardian was advised if they have not already done so to contact the registration department to sign all necessary forms in order for us  to release information regarding their care.   Consent: Patient/Guardian gives verbal consent for treatment and assignment of benefits for services provided during this visit. Patient/Guardian expressed understanding and agreed to proceed.   Fonda Conroy, LCSW

## 2024-01-13 ENCOUNTER — Other Ambulatory Visit (HOSPITAL_COMMUNITY): Payer: Self-pay | Admitting: Psychiatry

## 2024-01-18 ENCOUNTER — Ambulatory Visit (HOSPITAL_COMMUNITY): Admitting: Psychiatry

## 2024-01-25 ENCOUNTER — Ambulatory Visit (HOSPITAL_COMMUNITY): Admitting: Psychiatry

## 2024-01-25 ENCOUNTER — Encounter (HOSPITAL_COMMUNITY): Payer: Self-pay | Admitting: Psychiatry

## 2024-01-25 ENCOUNTER — Other Ambulatory Visit: Payer: Self-pay

## 2024-01-25 VITALS — BP 138/95 | HR 61 | Ht 68.5 in | Wt 223.2 lb

## 2024-01-25 DIAGNOSIS — F411 Generalized anxiety disorder: Secondary | ICD-10-CM

## 2024-01-25 DIAGNOSIS — F3340 Major depressive disorder, recurrent, in remission, unspecified: Secondary | ICD-10-CM

## 2024-01-25 DIAGNOSIS — F5102 Adjustment insomnia: Secondary | ICD-10-CM

## 2024-01-25 MED ORDER — FLUOXETINE HCL 20 MG PO CAPS: 20.0000 mg | ORAL_CAPSULE | Freq: Every day | ORAL | 1 refills | Status: AC

## 2024-01-25 NOTE — Progress Notes (Signed)
 [facility] Follow up visit   Patient Identification: Phillip [name] MRN:  [id number] Date of Evaluation:  01/25/2024 Referral Source: hospital discharge Chief Complaint:   No chief complaint on file.  Visit Diagnosis:    ICD-10-CM   1. MDD (recurrent major depressive disorder) in remission  F33.40     2. GAD (generalized anxiety disorder)  F41.1     3. Adjustment insomnia  F51.02       History of Present Illness: Patient is a 58 years old currently married Caucasian male initially referred at hospital discharge admitted at [facility] a month ago because of worsening of depression exacerbation of paranoia.  He currently works as a nature conservation officer in a company   Patient has presented with history of exacerbated stressors prior to hospital admission including porn addiction, job stress and affecting relationship.  He was also having paranoia as a police if somebody is watching him and he got decompensated before when to the hospital  Since hospital admission doing better patient is now back with his wife things are patching up he is trying to work on distraction from negative thoughts and avoiding online sites or porn addiction.  He is in therapy that is helping as well  There is no reported side effects he is following up with his primary care physician regarding the labs and doing work on weight loss getting his diabetes and and cholesterol.    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  Consequences of Substance Abuse: NA  Past Medical History:  Past Medical History:  Diagnosis Date   Anxiety    Borderline diabetic    Hypertension    Pre-diabetes     Past Surgical History:  Procedure Laterality Date   HERNIA REPAIR      Family Psychiatric History: grand mother: schizophrenia  Family History:  Family History  Problem Relation Age of Onset   Diabetes Mother    Diabetes Father    Heart failure Father    Schizophrenia Paternal Grandmother     Social History:   Social History   Socioeconomic History   Marital status: Married    Spouse name: Not on file   Number of children: Not on file   Years of education: Not on file   Highest education level: Not on file  Occupational History   Not on file  Tobacco Use   Smoking status: Never   Smokeless tobacco: Never  Vaping Use   Vaping status: Never Used  Substance and Sexual Activity   Alcohol use: Never   Drug use: Never   Sexual activity: Not on file  Other Topics Concern   Not on file  Social History Narrative   Not on file   Social Drivers of Health   Financial Resource Strain: Low Risk (03/16/2021)  Received from [facility]   Overall Financial Resource Strain (CARDIA)    Difficulty of Paying Living Expenses: Not very hard  Food Insecurity: Unknown (11/21/2023)   Received from [facility]   Hunger Vital Sign    Within the past 12 months, you worried that your food would run out before you got money to buy more: Patient declined to answer    Within the past 12 months, the food you bought just didn't last and you didn't have money to get more. : Patient declined to answer  Transportation Needs: Not on file (11/21/2023)  Physical Activity: Inactive (03/16/2021)   [facility] visits prior to 04/18/2022., [facility]   Exercise Vital Sign    On average, how many days per week do you engage in moderate to strenuous exercise (like a brisk walk)?: 0 days    On average, how many minutes do you engage in exercise at this level?: 0 min  Stress: Stress Concern Present (03/16/2021)   Received from [facility] visits prior to 04/18/2022., [facility]   [employer] of Occupational Health - Occupational Stress Questionnaire    Feeling of Stress : To some extent  Social Connections: Moderately Integrated (10/19/2023)   Social Connection and Isolation Panel    Frequency of Communication with Friends and Family: Once a week    Frequency of Social Gatherings with Friends and Family: Twice a week    Attends Religious Services: 1 to 4 times per year    Active Member of Clubs or Organizations: No    Attends Banker Meetings: Never    Marital Status: Married    Additional Social History: Patient grew up with his parents no trauma or abuse currently married for 29 years has 3 grown daughters  Allergies:  No Known Allergies  Metabolic Disorder Labs: Lab Results  Component Value Date   HGBA1C 6.0 (H) 10/19/2023   MPG 125.5 10/19/2023   No results found for: PROLACTIN Lab Results  Component Value Date   CHOL 172 10/19/2023   TRIG 127 10/19/2023   HDL 52 10/19/2023   CHOLHDL 3.3 10/19/2023   VLDL 25 10/19/2023   LDLCALC 95 10/19/2023   Lab Results  Component Value Date   TSH 2.398 10/19/2023    Therapeutic Level Labs: No results found for: LITHIUM No results found for: CBMZ No results found for: VALPROATE  Current Medications: Current Outpatient Medications  Medication Sig Dispense Refill   metFORMIN  (FORTAMET ) 1000 MG (OSM) 24 hr tablet Take 1 tablet (1,000 mg total) by mouth 2 (two) times daily with a meal.     QUEtiapine  (SEROQUEL ) 50 MG tablet Take 1 tablet (50 mg total) by mouth at  bedtime. 90 tablet 0   Semaglutide (OZEMPIC, 2 MG/DOSE, [place]) Inject 2 mg into the skin once a week.     valsartan (DIOVAN) 160 MG tablet Take 160 mg by mouth daily.     FLUoxetine  (PROZAC ) 20 MG capsule Take 1 capsule (20 mg total) by mouth daily. 30 capsule 1   irbesartan  (AVAPRO ) 150 MG tablet Take 1 tablet (150 mg total) by mouth daily. (Patient not taking: Reported on 01/25/2024)     Multiple Vitamin (MULTIVITAMIN WITH MINERALS) TABS tablet Take 1 tablet by mouth daily. (Patient not taking: Reported on 01/25/2024) 30 tablet 0   No current facility-administered medications for this visit.     Psychiatric Specialty Exam: Review of Systems  Cardiovascular:  Negative for chest  pain.  Neurological:  Negative for tremors.  Psychiatric/Behavioral:  Negative for self-injury.     Blood pressure (!) 138/95, pulse 61, height 5' 8.5 (1.74 m), weight 223 lb 3.2 oz (101.2 kg), SpO2 98%.Body mass index is 33.44 kg/m.  General Appearance: Casual  Eye Contact:  Good  Speech:  Clear and Coherent  Volume:  Normal  Mood:  Euthymic  Affect:  Congruent  Thought Process:  Goal Directed  Orientation:  Full (Time, Place, and Person)  Thought Content:  Rumination  Suicidal Thoughts:  No  Homicidal Thoughts:  No  Memory:  Immediate;   Fair  Judgement:  Fair  Insight:  Fair  Psychomotor Activity:  Normal  Concentration:  Concentration: Fair  Recall:  Good  Fund of Knowledge:Good  Language: Good  Akathisia:  No  Handed:    AIMS (if indicated):  no involuntary movements  Assets:  Desire for Improvement  ADL's:  Intact  Cognition: WNL  Sleep:  variable    Screenings: AUDIT    Flowsheet Row Admission (Discharged) from 10/19/2023 in [facility]  Alcohol Use Disorder Identification Test Final Score (AUDIT) 0   GAD-7    Flowsheet Row Office Visit from 01/25/2024 in [facility] Office Visit from 12/14/2023 in [facility] at [facility]  Total GAD-7 Score 2 5   PHQ2-9    Flowsheet Row Office Visit from 01/25/2024 in [facility] at [facility] Counselor from 01/06/2024 in [facility] at [facility] Office Visit from 12/14/2023 in [facility] at [facility]  PHQ-2 Total Score 0 2 2  PHQ-9 Total Score 0 9 9   Flowsheet Row Counselor from 01/06/2024 in [facility] at [facility] Office Visit from 12/14/2023 in [facility] at [facility] Admission (Discharged) from 10/19/2023 in [facility]IC[facility]  C-SSRS RISK CATEGORY Low Risk Error: Q3, 4, or 5 should not be populated when Q2 is No No Risk    Assessment and Plan: as follows  Prior documentation reviewed Major depressive disorder recurrent moderate to severe; reasonable since hospital admission continue Prozac .  Seroquel  50 mg at night continue therapy denies paranoia   Generalized anxiety disorder: manageable continue prozac  Denies chest pain  understands to follow with PCP and continue monitor BP   Recommend continue  therapy to work on his stressors see above  Insomnia; sleep has improved continue Seroquel  sleep hygiene  Fu 3 m . Questions addressed   Direct care time  20  minutes with chart review, face-to-face, documentation and collaboration if any Collaboration of Care: Psychiatrist AEB hospital discharge and chart reviewed  Patient/Guardian was advised Release of Information must be obtained prior to any record release in order to collaborate their care with an outside provider. Patient/Guardian was advised if they have not already done so to contact the registration department to sign all necessary forms in order for us  to release information regarding their care.   Consent: Patient/Guardian gives  verbal consent for treatment and assignment of benefits for services provided during this visit. Patient/Guardian expressed understanding and agreed to proceed.   Jackey Flight, MD 12/9/20252:39 PM

## 2024-02-01 ENCOUNTER — Ambulatory Visit (HOSPITAL_COMMUNITY): Admitting: Licensed Clinical Social Worker

## 2024-02-03 ENCOUNTER — Ambulatory Visit (HOSPITAL_COMMUNITY): Admitting: Licensed Clinical Social Worker

## 2024-02-03 DIAGNOSIS — F3340 Major depressive disorder, recurrent, in remission, unspecified: Secondary | ICD-10-CM | POA: Diagnosis not present

## 2024-02-03 NOTE — Progress Notes (Unsigned)
 THERAPIST PROGRESS NOTE  Session Time:8:00 am-8:45 am  Type of Therapy: {CHL AMB BH Type of Therapy:21022741}  Session#   Treatment Goals: ***  Session Goals:  Behavior: Patient was oriented x4 (person, place, situation, and time) . Patient was  {[facility] AFFECT:22266}. Patient was {Appearance:22683}. Patient made *** progress on *** goals at this time.   Interventions: Therapist utilized CBT and  to address ***. Therapist provided support and empathy to patient during session.    Response:    Patient engaged in session. Patient responded well to interventions. Patient continues to meet criteria for MDD (recurrent major depressive disorder) in remission  Patient will continue in outpatient therapy due to being the least restrictive service to meet her needs.   Plan: ***  Suicidal/Homicidal:  {[facility] YES OR NO:22294} {yes/no/with/without intent/plan:22693}   {CHL IP [facility] SUICIDE RISK ATTEMPT AS MANIFESTED AB:6954983}  Protective Factors: {Protective Factors for Suicide Mpdx:69585986}   Collaboration of Care: {BH OP Collaboration of Care:21014065}  Patient/Guardian was advised Release of Information must be obtained prior to any record release in order to collaborate their care with an outside provider. Patient/Guardian was advised if they have not already done so to contact the registration department to sign all necessary forms in order for us  to release information regarding their care.   Consent: Patient/Guardian gives verbal consent for treatment and assignment of benefits for services provided during this visit. Patient/Guardian expressed understanding and agreed to proceed.

## 2024-02-04 ENCOUNTER — Encounter (HOSPITAL_COMMUNITY): Payer: Self-pay

## 2024-02-11 ENCOUNTER — Telehealth (HOSPITAL_COMMUNITY): Payer: Self-pay | Admitting: Psychiatry

## 2024-02-11 MED ORDER — QUETIAPINE FUMARATE 50 MG PO TABS: 50.0000 mg | ORAL_TABLET | Freq: Every day | ORAL | 0 refills | Status: AC

## 2024-02-11 NOTE — Telephone Encounter (Signed)
 Received fax from patient's pharmacy requesting 90-day prescription for  QUEtiapine  (SEROQUEL ) 50 MG tablet  .   [id number] - [place], [place] - [address] (Ph: [phone])   Last ordered: 12/20/2023 - 90 tablets Last visit: 01/25/2024 Next visit:  05/23/2024

## 2024-02-29 ENCOUNTER — Ambulatory Visit (HOSPITAL_COMMUNITY): Admitting: Licensed Clinical Social Worker

## 2024-03-23 ENCOUNTER — Ambulatory Visit (HOSPITAL_COMMUNITY): Admitting: Licensed Clinical Social Worker

## 2024-04-13 ENCOUNTER — Ambulatory Visit (HOSPITAL_COMMUNITY): Admitting: Licensed Clinical Social Worker

## 2024-05-23 ENCOUNTER — Ambulatory Visit (HOSPITAL_COMMUNITY): Admitting: Psychiatry
